# Patient Record
Sex: Male | Born: 1955
Health system: Southern US, Community
[De-identification: ages and names within clinical notes are randomized; demographics above are authoritative.]

## PROBLEM LIST (undated history)

## (undated) DIAGNOSIS — I10 Essential (primary) hypertension: Secondary | ICD-10-CM

## (undated) DIAGNOSIS — K219 Gastro-esophageal reflux disease without esophagitis: Secondary | ICD-10-CM

## (undated) DIAGNOSIS — E119 Type 2 diabetes mellitus without complications: Secondary | ICD-10-CM

## (undated) HISTORY — DX: Type 2 diabetes mellitus without complications: E11.9

## (undated) HISTORY — DX: Essential (primary) hypertension: I10

## (undated) HISTORY — PX: OTHER SURGICAL HISTORY: SHX169

## (undated) HISTORY — DX: Gastro-esophageal reflux disease without esophagitis: K21.9

## (undated) HISTORY — PX: NO PAST SURGERIES: SHX2092

---

## 2000-01-05 ENCOUNTER — Encounter: Payer: Self-pay | Admitting: Emergency Medicine

## 2000-01-05 ENCOUNTER — Emergency Department (HOSPITAL_COMMUNITY): Admission: EM | Admit: 2000-01-05 | Discharge: 2000-01-05 | Payer: Self-pay | Admitting: Emergency Medicine

## 2007-07-22 ENCOUNTER — Ambulatory Visit: Payer: Self-pay | Admitting: General Practice

## 2010-02-01 ENCOUNTER — Ambulatory Visit: Payer: Self-pay | Admitting: General Practice

## 2011-02-20 ENCOUNTER — Ambulatory Visit: Payer: Self-pay | Admitting: General Practice

## 2016-05-09 ENCOUNTER — Other Ambulatory Visit: Payer: Self-pay | Admitting: Physician Assistant

## 2016-06-19 ENCOUNTER — Other Ambulatory Visit: Payer: Self-pay | Admitting: Family Medicine

## 2016-06-19 ENCOUNTER — Ambulatory Visit
Admission: RE | Admit: 2016-06-19 | Discharge: 2016-06-19 | Disposition: A | Discharge status: Home / Self Care | Payer: BLUE CROSS/BLUE SHIELD | Source: Ambulatory Visit | Attending: Family Medicine | Admitting: Family Medicine

## 2016-06-19 DIAGNOSIS — R600 Localized edema: Secondary | ICD-10-CM | POA: Diagnosis not present

## 2016-06-19 DIAGNOSIS — M7121 Synovial cyst of popliteal space [Baker], right knee: Secondary | ICD-10-CM | POA: Insufficient documentation

## 2016-06-19 DIAGNOSIS — R52 Pain, unspecified: Secondary | ICD-10-CM

## 2016-11-13 IMAGING — US US EXTREM LOW VENOUS*R*
1 series · 14 of 24 positions shown · non-contrast
Comparison: None.

CLINICAL DATA: Right lower extremity edema for 3 weeks.

EXAM:
RIGHT LOWER EXTREMITY VENOUS DOPPLER ULTRASOUND
TECHNIQUE: Gray-scale sonography with graded compression, as well as color
Doppler and duplex ultrasound, were performed to evaluate the deep
venous system from the level of the common femoral vein through the
popliteal and proximal calf veins. Spectral Doppler was utilized to
evaluate flow at rest and with distal augmentation maneuvers.

[Series 1: us extrem low venous*right* · 0.09mm/px · 14 of 41 slices shown]
[im 1/41]
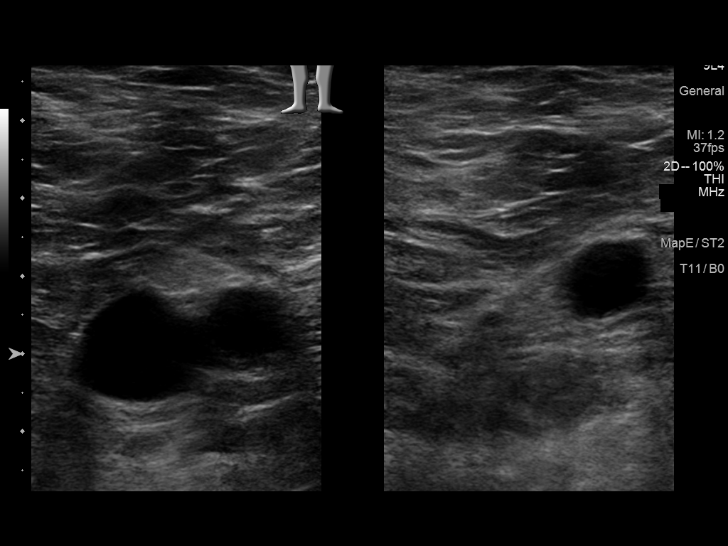
[im 4/41]
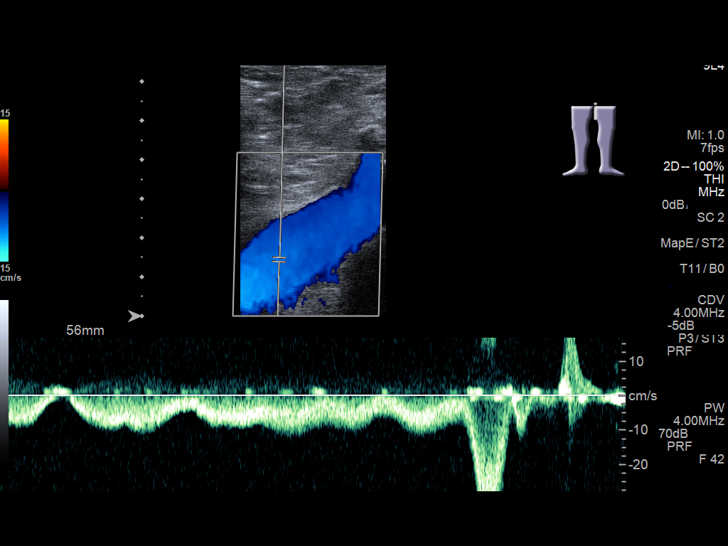
[im 7/41]
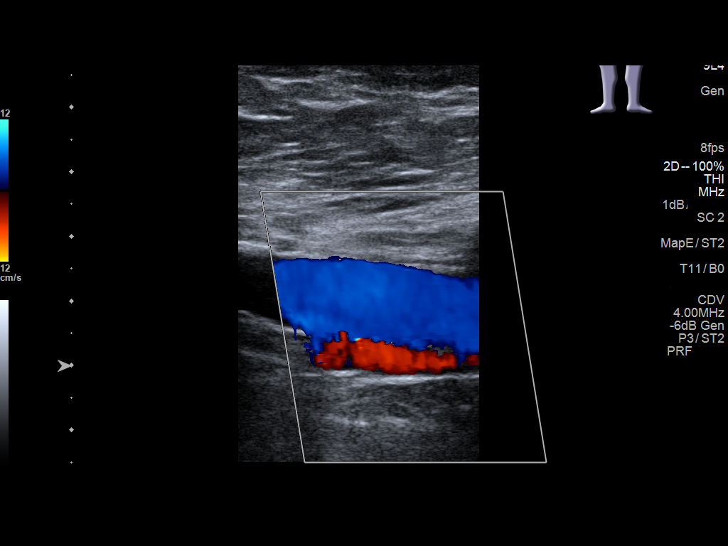
[im 11/41]
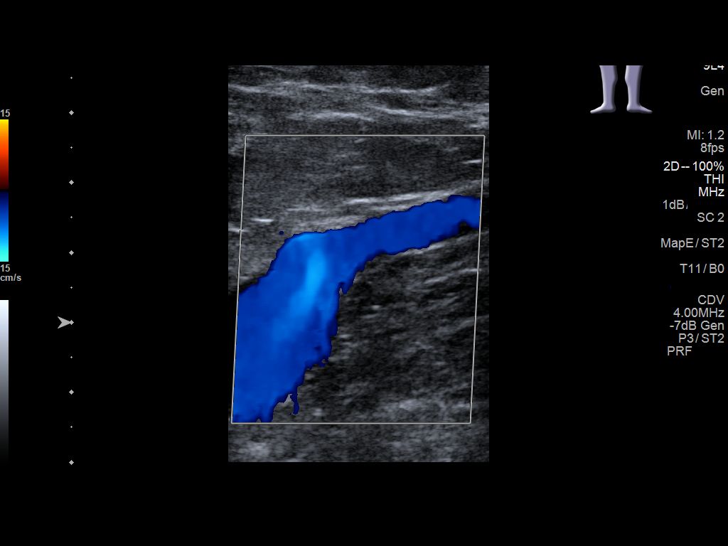
[im 13/41]
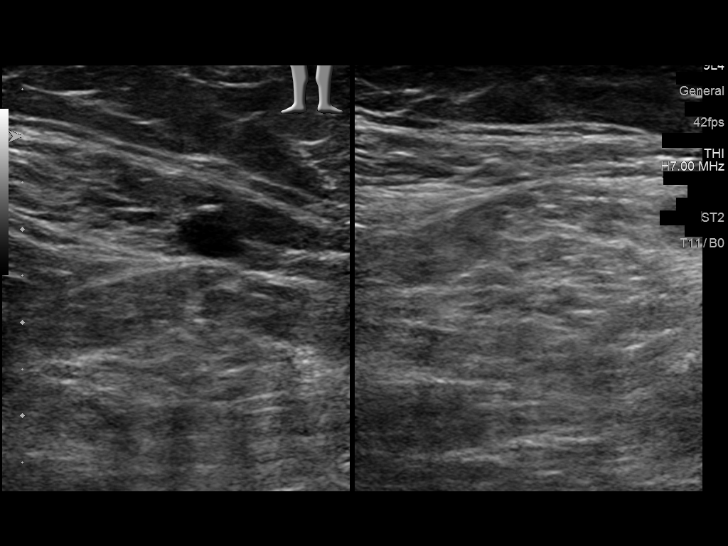
[im 16/41]
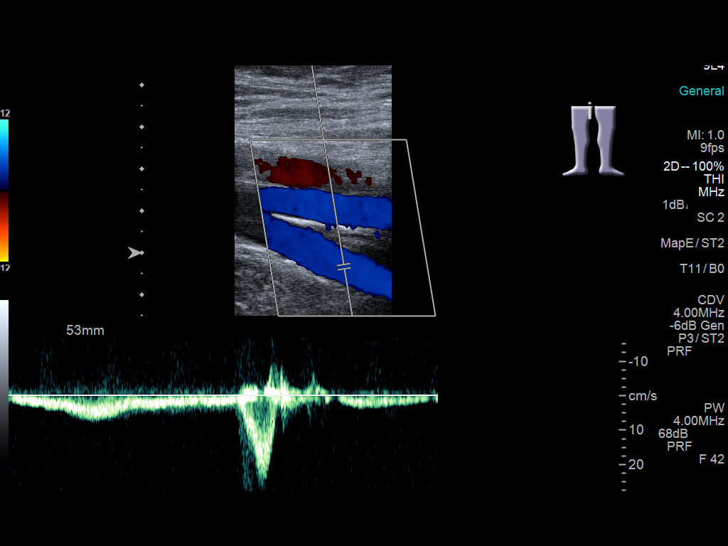
[im 20/41]
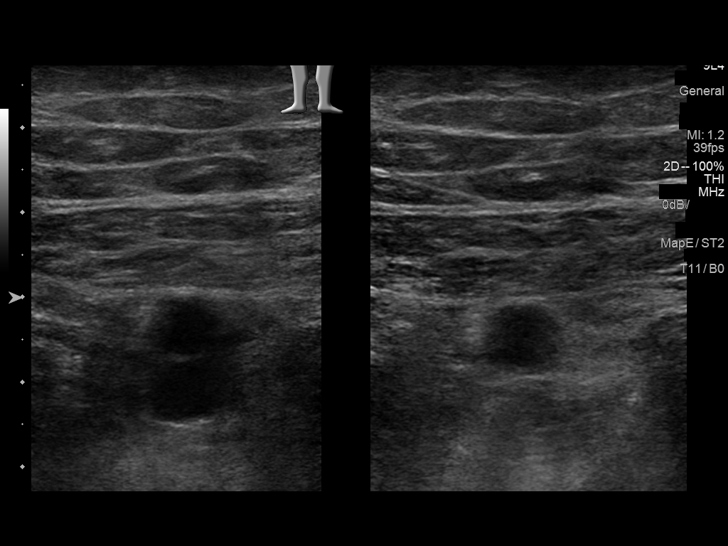
[im 21/41]
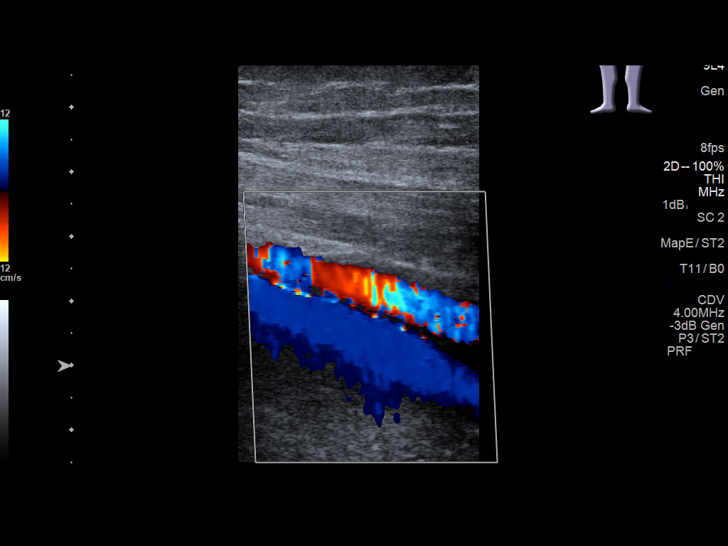
[im 25/41]
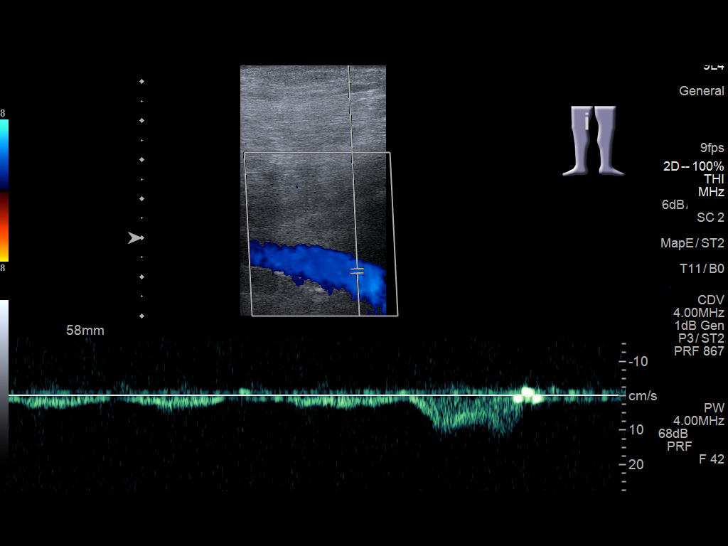
[im 28/41]
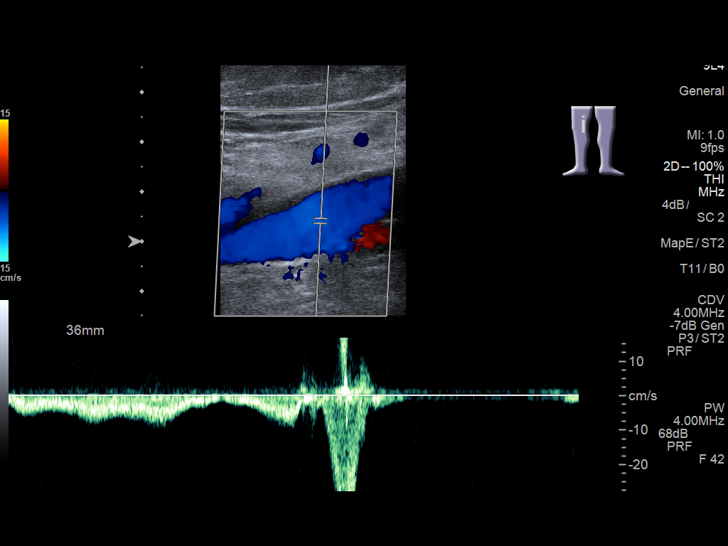
[im 32/41]
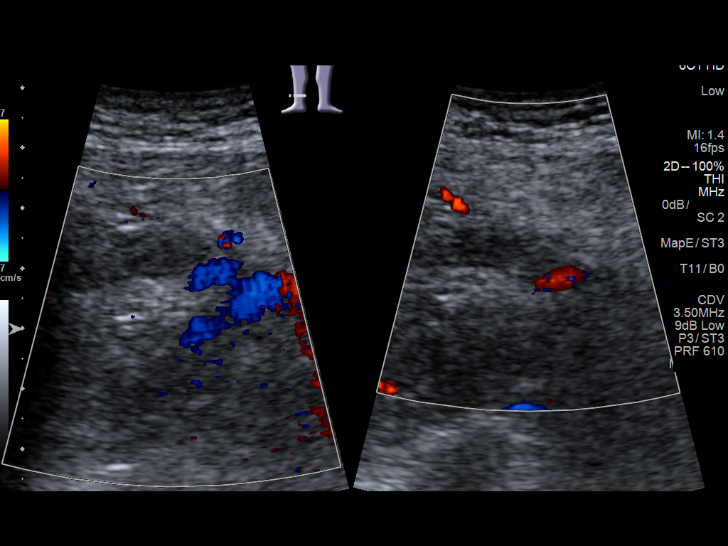
[im 34/41]
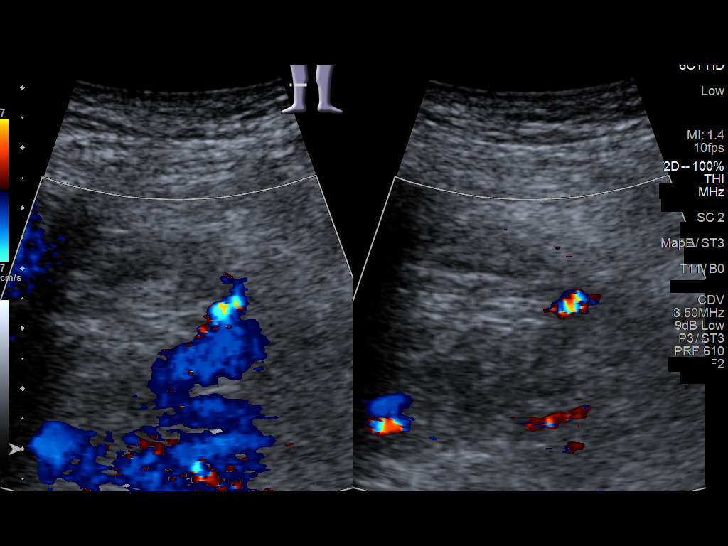
[im 37/41]
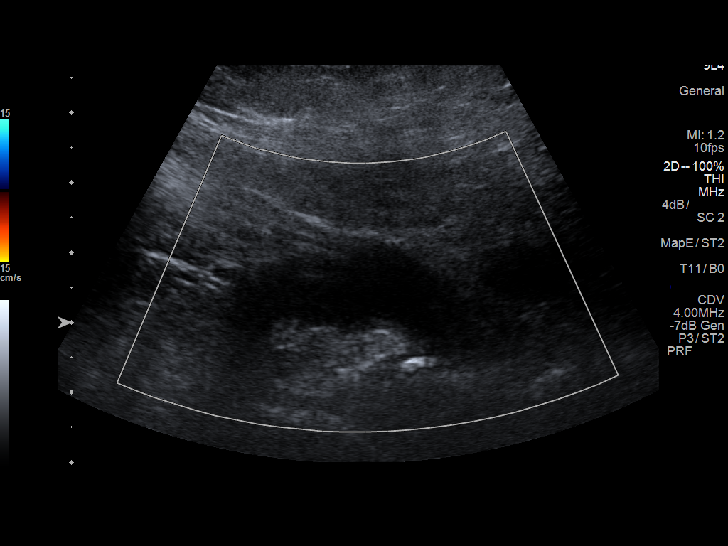
[im 41/41]
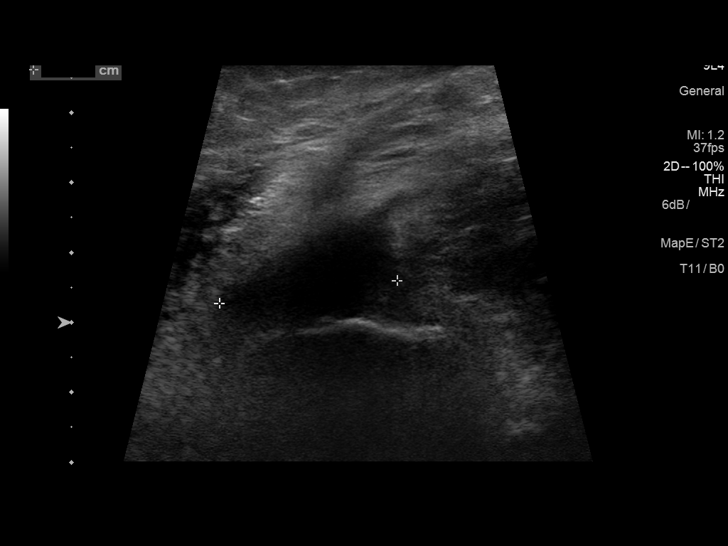

[14 of 24 positions shown; findings below may reference images not displayed]

FINDINGS: Left common femoral vein is patent without thrombus.

Normal compressibility, augmentation and color Doppler flow in the
right common femoral vein, right femoral vein and right popliteal
vein. The right saphenofemoral junction is patent. Right profunda
femoral vein is patent without thrombus. Visualized right deep calf
veins are patent without thrombus.

Irregular shaped hypoechoic structure in the right popliteal fossa.
This structure measures 4.5 x 1.4 x 2.6 cm. Findings are suggestive
for a fluid collection.
IMPRESSION: Negative for deep venous thrombosis in right lower extremity.

Right Baker's cyst.

## 2016-11-26 LAB — HM COLONOSCOPY

## 2019-01-04 ENCOUNTER — Other Ambulatory Visit: Payer: Self-pay | Admitting: Registered Nurse

## 2019-03-04 ENCOUNTER — Other Ambulatory Visit: Payer: Self-pay

## 2019-03-05 LAB — CMP12+LP+TP+TSH+6AC+PSA+CBC…
ALT: 28 IU/L
AST: 22 IU/L (ref 0–40)
Albumin/Globulin Ratio: 1.6 (ref 1.2–2.2)
Albumin: 4.2 g/dL (ref 3.8–4.8)
BUN/Creatinine Ratio: 17
Basophils Absolute: 0.1 10*3/uL
Basos: 1 %
Bilirubin Total: 0.2 mg/dL (ref 0.0–1.2)
Calcium: 9 mg/dL
Chloride: 101 mmol/L (ref 96–106)
Chol/HDL Ratio: 4 ratio
Cholesterol, Total: 158 mg/dL
EOS (ABSOLUTE): 0.1 10*3/uL
Eos: 1 %
Free Thyroxine Index: 1.9 (ref 1.2–4.9)
GGT: 21 IU/L
Globulin, Total: 2.7 g/dL (ref 1.5–4.5)
Glucose: 198 mg/dL — ABNORMAL HIGH (ref 65–99)
HDL: 40 mg/dL (ref >39)
Hematocrit: 40 %
Hemoglobin: 13.5 g/dL
Immature Granulocytes: 1 %
Iron: 84 ug/dL
LDH: 160 IU/L
LDL Calculated: 98 mg/dL
Lymphocytes Absolute: 1.9 10*3/uL
Lymphs: 22 %
MCH: 28.8 pg
MCHC: 33.8 g/dL
MCV: 85 fL
Monocytes Absolute: 0.6 10*3/uL
Monocytes: 7 %
Neutrophils Absolute: 5.7 10*3/uL
Neutrophils: 68 %
Phosphorus: 2.9 mg/dL
Platelets: 247 10*3/uL (ref 150–450)
Potassium: 4.6 mmol/L (ref 3.5–5.2)
Prostate Specific Ag, Serum: 0.6 ng/mL
RBC: 4.69 x10E6/uL
RDW: 12.5 %
Sodium: 138 mmol/L (ref 134–144)
T3 Uptake Ratio: 24 %
T4, Total: 7.8 ug/dL (ref 4.5–12.0)
TSH: 2.69 u[IU]/mL (ref 0.450–4.500)
Total Protein: 6.9 g/dL (ref 6.0–8.5)
Triglycerides: 102 mg/dL
Uric Acid: 4.8 mg/dL
VLDL Cholesterol Cal: 20 mg/dL (ref 5–40)
WBC: 8.3 10*3/uL (ref 3.4–10.8)

## 2019-03-05 LAB — CMP12+LP+TP+TSH+6AC+PSA+CBC?
Alkaline Phosphatase: 88 IU/L
BUN: 13 mg/dL
Creatinine, Ser: 0.76 mg/dL
Immature Grans (Abs): 0.1 10*3/uL

## 2019-03-05 LAB — MICROALBUMIN / CREATININE URINE RATIO
Creatinine, Urine: 143.5 mg/dL
Microalb/Creat Ratio: 3 mg/g creat (ref 0–29)
Microalbumin, Urine: 4.4 ug/mL

## 2019-03-05 LAB — HGB A1C W/O EAG: Hgb A1c MFr Bld: 9.3 % — ABNORMAL HIGH (ref 4.8–5.6)

## 2019-05-01 ENCOUNTER — Other Ambulatory Visit: Payer: Self-pay

## 2019-05-01 MED ORDER — PANTOPRAZOLE SODIUM 40 MG PO TBEC: 40.0000 mg | DELAYED_RELEASE_TABLET | Freq: Every day | ORAL | 1 refills | Status: DC | PRN

## 2019-05-01 NOTE — Telephone Encounter (Signed)
Pt seen for physical on 03-04-19 states protonix is helpful in controlling GERD. Last refill was 02-03-09 #30 with 3 refills.

## 2019-06-09 ENCOUNTER — Other Ambulatory Visit: Payer: Self-pay

## 2019-06-09 ENCOUNTER — Ambulatory Visit: Payer: Self-pay | Admitting: Internal Medicine

## 2019-06-09 ENCOUNTER — Encounter: Payer: Self-pay | Admitting: Internal Medicine

## 2019-06-09 DIAGNOSIS — E785 Hyperlipidemia, unspecified: Secondary | ICD-10-CM | POA: Insufficient documentation

## 2019-06-09 DIAGNOSIS — Z9189 Other specified personal risk factors, not elsewhere classified: Secondary | ICD-10-CM

## 2019-06-09 DIAGNOSIS — K219 Gastro-esophageal reflux disease without esophagitis: Secondary | ICD-10-CM | POA: Insufficient documentation

## 2019-06-09 DIAGNOSIS — E7849 Other hyperlipidemia: Secondary | ICD-10-CM

## 2019-06-09 DIAGNOSIS — E119 Type 2 diabetes mellitus without complications: Secondary | ICD-10-CM

## 2019-06-09 DIAGNOSIS — I10 Essential (primary) hypertension: Secondary | ICD-10-CM | POA: Insufficient documentation

## 2019-06-09 DIAGNOSIS — E1169 Type 2 diabetes mellitus with other specified complication: Secondary | ICD-10-CM | POA: Insufficient documentation

## 2019-06-09 LAB — POCT GLYCOSYLATED HEMOGLOBIN (HGB A1C): Hemoglobin A1C: 7.6 % — AB (ref 4.0–5.6)

## 2019-06-09 NOTE — Progress Notes (Signed)
S - Patient with a h/o NIDDM who follows-up for the every 3 month check-up after last in June, and at that time Metformin restarted as he ran out and never refilled and felt needed to restart. Also Lipitor started for higher LDL. BP was ok on lisinopril at that visit.   He noted would think about recommendation last visit to pursue a sleep study as at risk with weight and snores per wife, he denied marked daytime somnolence last visit, and does note he has felt more tired, he thought from the metformin restarting and has decreased to one pill daily (500mg ) as a result to help. He hasn't checked his BS's recently but noted he can.   In general, he has been feeling well with no complaints Taking the medicines regularly Not check blood sugars regularly presently No CP, SOB, urine frequency, numbness or tingling in the extremities, no LE swelling  Does get yearly eye exams.  Trying to eat a healthier diet and manage weight, with some success since last visit Exercise - no regular exercise regimen and working at home not helped noted in June, is back to work and his grandson is with him and helps him be more active, trying to walk more  No tob use except a cigar occasionally   Allergies  Allergen Reactions  . Penicillins   . Sulfa Antibiotics    Current Outpatient Medications on File Prior to Visit  Medication Sig Dispense Refill  . atorvastatin (LIPITOR) 10 MG tablet TAKE 1 TABLET DAILY IN PM    . glimepiride (AMARYL) 4 MG tablet Take 4 mg by mouth daily.    Marland Kitchen lisinopril (ZESTRIL) 20 MG tablet Take 20 mg by mouth daily.    . metFORMIN (GLUCOPHAGE-XR) 500 MG 24 hr tablet Take 500 mg by mouth daily.     . pantoprazole (PROTONIX) 40 MG tablet Take 1 tablet (40 mg total) by mouth daily as needed. 90 tablet 1   No current facility-administered medications on file prior to visit.       O - NAD, masked, obese  BP 137/85 (BP Location: Right Arm, Patient Position: Sitting, Cuff Size: Large)    Pulse 85   Temp 97.7 F (36.5 C)   Resp 16   Ht 6\' 1"  (1.854 m)   Wt (!) 301 lb (136.5 kg)   SpO2 96%   BMI 39.71 kg/m   Weight - 308 last visit  HEENT - sclera anicteric Neck - carotids 2+ and = with no bruits,  Car - RRR without m/g/r Pulm - CTA Abd - obese, NT Ext - no LE edema Neuro - Affect not flat, approp with conversation  Grossly nonfocal  Last colonoscopy - 2018, next rec'ed in 10 years  Labs reviewed including A1C - 9.3 in June, in 7's on checks July and Nov 2019, urine microalb ok in June, LDL 98   A1C today in office - 7.6  A/P - NIDDM - better controlled with restart of Metformin XR, although only at 500mg , not the 1000mg  was on prior  Continue current medications, discussed increasing the dose of the metforminXR, possibly to 500 mg bid or 1000mg  in the am, noted a 750mg  dose can try as well. Agreed to continue the 500mg  dose at present and monitor and if cont's to lose some weight and stay more active, that can help lower BS's as well,  Importance of diet modifications and some regular aerobic exercise encouraged Above to help with weight control/weight loss also important  Discussed why important to keep sugars very well controlled to help protect the heart, kidneys, and lessen future complications. ]If feeling sx's, would check his BS at that time and record on a sheet, also may check occasionally periodically and if higher than would like (>200 consistently), to f/u sooner than planned noted   2.  Hyperlipidemia  statin to continue and tolerating Importance of diet modifications emphasized and some regular aerobic exercise encouraged Above to help with weight control/weight loss also very important   3. Increased BMI/obesity - has lost some weight since last visit  Importance of diet modifications and regular aerobic exercise emphasized as above noted Encouraged and continue to try to lose weight  4. HTN - controlled on medicines  Continue medication  to manage Importance of healthy diet and regular aerobic exercise and weight control noted to help with BP control Continue to monitor  5. GERD - controlled with PPI  6. Sleep apnea concern - at risk and noted his feeling tired may be from this and not the Metformin XR. Discussed this last visit as well.  He agreed to pursue a sleep study after the risk/benefits discussed at length today  Referral written and await testing results  F/u again in 3 months, sooner prn and will f/u before he plans to retire in early December

## 2019-07-02 ENCOUNTER — Ambulatory Visit: Payer: Self-pay

## 2019-07-02 DIAGNOSIS — Z23 Encounter for immunization: Secondary | ICD-10-CM

## 2019-08-07 ENCOUNTER — Other Ambulatory Visit: Payer: Self-pay | Admitting: Internal Medicine

## 2019-08-14 ENCOUNTER — Telehealth: Payer: Self-pay

## 2019-08-14 NOTE — Telephone Encounter (Signed)
James Washington [phone]) called because having difficulty getting Rx refill for Protonix.  Protonix 40 mg tablets 1 po qd  Contacted [employer] at [phone] & spoke with CSR Lattie Haw T) & received authorization for Protonix 40 mg 1 tab po qd. Approval is for 3 years starting today (08/14/2019 - 08/13/2022) & pharmacy may process refill today.  Contacted [facility] [address] [facility] [place] at [phone] to let them know prior authorization to let them know med approved for 3 years.  Contacted [facility] to let him know PA approved & [facility] working on [facility] refill.  AMD

## 2019-08-17 ENCOUNTER — Encounter: Payer: Self-pay | Admitting: Internal Medicine

## 2019-08-17 ENCOUNTER — Other Ambulatory Visit: Payer: Self-pay | Admitting: Internal Medicine

## 2019-08-17 DIAGNOSIS — K219 Gastro-esophageal reflux disease without esophagitis: Secondary | ICD-10-CM

## 2019-08-17 NOTE — Telephone Encounter (Signed)
Currently on schedule for DM labs on 09/03/2019 & follow up appt on 09/10/2019.  Added Magnesium to the notes on 09/03/2019.  AMD

## 2019-08-17 NOTE — Telephone Encounter (Signed)
Patient diabetic last seen by Dr Roxan Hockey 06/09/2019. Long term PPI use; last labs exec panel 03/04/2019 stable and HgbA1c stat POC 06/09/2019 7.6 will be due labs Dec for diabetes will add magnesium nonfasting.  Electronic Rx for 90 day supply protonix 40mg  po daily sent to his pharmacy of choice today.

## 2019-08-17 NOTE — Telephone Encounter (Signed)
noted 

## 2019-09-03 ENCOUNTER — Other Ambulatory Visit: Payer: Self-pay

## 2019-09-03 DIAGNOSIS — E119 Type 2 diabetes mellitus without complications: Secondary | ICD-10-CM

## 2019-09-03 NOTE — Progress Notes (Signed)
Magnesium added per request of Gerarda Fraction, NP-C (Interim Provider).  Scheduled to review lab results 09/10/2019 with Laurey Morale, PA-C (Interim Provider).  AMD

## 2019-09-04 LAB — MAGNESIUM: Magnesium: 2.1 mg/dL (ref 1.6–2.3)

## 2019-09-04 LAB — BUN+CREAT
BUN/Creatinine Ratio: 20 (ref 10–24)
BUN: 17 mg/dL (ref 8–27)
Creatinine, Ser: 0.83 mg/dL (ref 0.76–1.27)
GFR calc Af Amer: 107 mL/min/{1.73_m2} (ref >59)
GFR calc non Af Amer: 93 mL/min/{1.73_m2} (ref >59)

## 2019-09-04 LAB — HGB A1C W/O EAG: Hgb A1c MFr Bld: 8.8 % — ABNORMAL HIGH (ref 4.8–5.6)

## 2019-09-10 ENCOUNTER — Ambulatory Visit: Payer: Self-pay | Admitting: Physician Assistant

## 2019-09-10 ENCOUNTER — Other Ambulatory Visit: Payer: Self-pay

## 2019-09-10 ENCOUNTER — Encounter: Payer: Self-pay | Admitting: Physician Assistant

## 2019-09-10 VITALS — BP 130/80 | HR 94 | Temp 99.2°F | Resp 12 | Ht 73.0 in | Wt 301.0 lb

## 2019-09-10 DIAGNOSIS — I10 Essential (primary) hypertension: Secondary | ICD-10-CM

## 2019-09-10 DIAGNOSIS — E119 Type 2 diabetes mellitus without complications: Secondary | ICD-10-CM

## 2019-09-10 NOTE — Progress Notes (Signed)
   Subjective:    Patient ID: James Washington, male    DOB: [birth year], 63 y.o.   MRN: [id number]  HPI   6 mos f/u   - Had Annual exam with Dr Roxan Hockey  03/10/2019,   3 mos exam 06/09/2019    137/85     301    A1C   7.6 6 mos exam today          130/80               301          A1C 8.8  63 yo M with longstanding DM MetforminER  1000mg   1 QD Glimepiride 4 mg 1 po QD  Today A1 C   8.8  HT hx  Lisinopril 20mg  1 QD understands that diet and weight play important part  Lipids- Lipitor 10 mg 1 qd  GERD-  Protonix 40 mg 1 QD,  DIetary managment  Diet indiscretion- readily admits to eating "whatever" No walking  program since her retired in Aug  Aware that exercise is important particularly with increased dietary intake  Review of Systems Denies particular concerns    Objective:   Physical Exam  Wt 301 BP 130/80 Alert interactive relaxed HEENT grossly WNL - left eyelid with enlarging cyst /lesion Has been addressed in past and patient encouraged to have Opthal consult Neck supple , no glandular enlargement Breathing non labored RSR no GMR Gynecomastia Obese , no mass noted, BS WNL Ambulatory w/o hesitation  on /off table unassisted      Assessment & Plan:   Reviewed dietary recommendations Discussed concern over A1C  8.8 Start today with 10 minute brisk walk TID As tolerance improves may adjust to 20 TID or 30 QD  He agrees to Passenger transport manager together re :same Return to basic DM guidelines- serving sizes- healthy food choices....ROS  Rec consultation Opthal  RTC 2 month DM follow up  A1C- WEIGHT - BP Encouraged keeping a calendar of walking and other exercise to be reviewed at f/u

## 2019-09-10 NOTE — Progress Notes (Signed)
2x - 1 time at beach & 1 time at home had some whelps around his neck after eating shrimp & scallops. States has eaten all his life without problems.  AMD

## 2019-09-21 ENCOUNTER — Encounter: Payer: Self-pay | Admitting: Physician Assistant

## 2019-11-12 ENCOUNTER — Other Ambulatory Visit: Payer: Self-pay

## 2019-11-12 ENCOUNTER — Ambulatory Visit: Payer: Self-pay | Admitting: Physician Assistant

## 2019-11-12 VITALS — BP 124/90 | HR 95 | Temp 98.1°F | Ht 73.0 in | Wt 299.6 lb

## 2019-11-12 DIAGNOSIS — I10 Essential (primary) hypertension: Secondary | ICD-10-CM

## 2019-11-12 DIAGNOSIS — K219 Gastro-esophageal reflux disease without esophagitis: Secondary | ICD-10-CM

## 2019-11-12 DIAGNOSIS — E119 Type 2 diabetes mellitus without complications: Secondary | ICD-10-CM

## 2019-11-12 LAB — POCT GLYCOSYLATED HEMOGLOBIN (HGB A1C)
HbA1c POC (<> result, manual entry): 8.5 % (ref 4.0–5.6)
Hemoglobin A1C: 8.5 % — AB (ref 4.0–5.6)

## 2019-11-12 NOTE — Progress Notes (Signed)
   Subjective:    Patient ID: James Washington, male    DOB: 1955-11-04, 64 y.o.   MRN: [id number]  HPI DM follow up patient now retired and will transition to PCP in town  He wanted to follow up as he" took last meeting to heart" And wanted to share his new efforts to be healthy Before transitioning away  [place]. [place] one direction is harder than the other and tends to take the easy way.    He wanted to clarify that he has been taking Metformin 500 q AM Some time ago was actually prescribed 1000mg  QAM but didn't feel well taking it...got sleepy, no GI symtoms.   Had cut back and is still take Metformin  500mg XR  q AM Glimeperide 4 mg q AM  Watching serving sizes- avoiding snacking "when possible" Trying to make healthier choices  Today is 2 month f/u A1C today 8.5 -down from 8.8 Weight today 299- down from 301   Review of Systems As noted    Objective:   Physical Exam  As above  BP 124/90  Left eyelid cyst continues to enlarge-       Assessment & Plan:  Now that retired and time not an issue have challenged him [place] - challenged him to alternating between hard and easy direction each exercise adventure. Next goal to do hard loop-and then after resting briefly, doing the easier loop before heading home.  Time and exertion goal for increased calorie burn  Weight loss goals for BP and DM  His new PCP visit is in 2 weeks. We discussed , and have deferred making any dosing changes at this time as he has enough medication to bridge left right now.  Stressed increasing his exercise and focusing hard on dietary choices and serving sizes-- may come by for weight check next week PRN  Opthal exam encouraged to address cyst left upper eyelid as it continues to enlarge  Congratulated on improvements made and encouraged continued focus!

## 2019-11-26 ENCOUNTER — Ambulatory Visit (INDEPENDENT_AMBULATORY_CARE_PROVIDER_SITE_OTHER): Payer: Medicare Other | Admitting: Family Medicine

## 2019-11-26 ENCOUNTER — Other Ambulatory Visit: Payer: Self-pay

## 2019-11-26 ENCOUNTER — Encounter: Payer: Self-pay | Admitting: Family Medicine

## 2019-11-26 VITALS — BP 138/72 | HR 86 | Temp 97.1°F | Ht 72.0 in | Wt 297.5 lb

## 2019-11-26 DIAGNOSIS — E7849 Other hyperlipidemia: Secondary | ICD-10-CM | POA: Diagnosis not present

## 2019-11-26 DIAGNOSIS — E119 Type 2 diabetes mellitus without complications: Secondary | ICD-10-CM | POA: Diagnosis not present

## 2019-11-26 DIAGNOSIS — I10 Essential (primary) hypertension: Secondary | ICD-10-CM

## 2019-11-26 NOTE — Assessment & Plan Note (Signed)
Elevated. Has been exercising/working on diet. Willing to try dose increase of metformin. Encouraged working to maximum tolerated dose. Return in 3 months for recheck

## 2019-11-26 NOTE — Assessment & Plan Note (Signed)
Discussed that he would benefit based on ASCVD and with diabetes may benefit from tighter control and will increase atorvastatin. Could stop ASA as he has already taken 10+ years

## 2019-11-26 NOTE — Patient Instructions (Addendum)
#  Diabetes - try to increase the metformin - start by taking 2 pills in the morning - If tolerating after 2 weeks, can start taking 3 pills  #Cholesterol medication - start taking 2 pills per day  Return in 3 months for diabetes check

## 2019-11-26 NOTE — Assessment & Plan Note (Signed)
BP at goal, continue lisinopril

## 2019-11-26 NOTE — Progress Notes (Signed)
Subjective:     James Washington is a 64 y.o. male presenting for Establish Care (previous PCP with [facility]) and Discuss cholesterol medication     HPI    #Diabetes Currently taking metformin and glimepiride -- metformin at high doses caused him to feel sleepy -- continues on glimepiride   Using medications without difficulties: No Hypoglycemic episodes:No  Hyperglycemic episodes:No  Feet problems:No  Last HgbA1c:  Lab Results  Component Value Date   HGBA1C 8.5 (A) 11/12/2019   HGBA1C 8.5 11/12/2019    Diabetes Health Maintenance Due:    Diabetes Health Maintenance Due  Topic Date Due  . FOOT EXAM  09/07/1964  . OPHTHALMOLOGY EXAM  09/07/1964  . HEMOGLOBIN A1C  05/11/2020    #HTN - on medication  #HLD - no hx of high cholesterol - started medication when diagnosed with diabetes - not currently with side effects -   #tobacco use - cigar when playing    Review of Systems   Social History   Tobacco Use  Smoking Status Current Some Day Smoker  . Years: 10.00  . Types: Cigars  Smokeless Tobacco Never Used  Tobacco Comment    once a month        Objective:    BP Readings from Last 3 Encounters:  11/26/19 138/72  11/12/19 124/90  09/10/19 130/80   Wt Readings from Last 3 Encounters:  11/26/19 297 lb 8 oz (134.9 kg)  11/12/19 299 lb 9.6 oz (135.9 kg)  09/10/19 (!) 301 lb (136.5 kg)    BP 138/72   Pulse 86   Temp (!) 97.1 F (36.2 C)   Ht 6' (1.829 m)   Wt 297 lb 8 oz (134.9 kg)   SpO2 98%   BMI 40.35 kg/m    Physical Exam Constitutional:      Appearance: Normal appearance. He is not ill-appearing or diaphoretic.  HENT:     Right Ear: External ear normal.     Left Ear: External ear normal.     Nose: Nose normal.  Eyes:     General: No scleral icterus.    Extraocular Movements: Extraocular movements intact.     Conjunctiva/sclera: Conjunctivae normal.  Cardiovascular:     Rate and Rhythm: Normal rate and  regular rhythm.  Pulmonary:     Effort: Pulmonary effort is normal. No respiratory distress.     Breath sounds: Normal breath sounds. No wheezing.  Musculoskeletal:     Cervical back: Neck supple.  Skin:    General: Skin is warm and dry.  Neurological:     Mental Status: He is alert. Mental status is at baseline.  Psychiatric:        Mood and Affect: Mood normal.      Lipid Panel     Component Value Date/Time   CHOL 158 03/04/2019 0930   TRIG 102 03/04/2019 0930   HDL 40 03/04/2019 0930   CHOLHDL 4.0 03/04/2019 0930   LDLCALC 98 03/04/2019 0930   LABVLDL 20 03/04/2019 0930     The 10-year ASCVD risk score Mikey Bussing DC Jr., et al., 2013) is: 38.1%   Values used to calculate the score:     Age: 56 years     Sex: Male     Is Non-Hispanic African American: No     Diabetic: Yes     Tobacco smoker: Yes     Systolic Blood Pressure: 299 mmHg     Is BP treated: Yes  HDL Cholesterol: 40 mg/dL     Total Cholesterol: 158 mg/dL      Assessment & Plan:   Problem List Items Addressed This Visit      Cardiovascular and Mediastinum   HTN (hypertension)    BP at goal, continue lisinopril        Endocrine   Diabetes (HCC) - Primary    Elevated. Has been exercising/working on diet. Willing to try dose increase of metformin. Encouraged working to maximum tolerated dose. Return in 3 months for recheck      Relevant Medications   metFORMIN (GLUCOPHAGE-XR) 500 MG 24 hr tablet     Other   Hyperlipidemia    Discussed that he would benefit based on ASCVD and with diabetes may benefit from tighter control and will increase atorvastatin. Could stop ASA as he has already taken 10+ years          Return in about 3 months (around 02/23/2020) for diabetes check.  Lynnda Child, MD

## 2019-12-07 ENCOUNTER — Other Ambulatory Visit: Payer: Self-pay | Admitting: Family Medicine

## 2019-12-07 DIAGNOSIS — E7849 Other hyperlipidemia: Secondary | ICD-10-CM

## 2019-12-07 MED ORDER — ATORVASTATIN CALCIUM 20 MG PO TABS: 20.0000 mg | ORAL_TABLET | Freq: Every evening | ORAL | 0 refills | Status: DC

## 2019-12-07 NOTE — Telephone Encounter (Signed)
Pulled in Atorvastatin with updated dose. Ok to send in test strips? How often should patient check his sugar a day?

## 2019-12-07 NOTE — Telephone Encounter (Signed)
Patient called He was in to see Dr Selena Batten on the 25th and she increased his Atorvastatin.  Patient had some tablets left and ended up doubling up to start the new dose, he stated he is now out of medication and needs the new script sent in for him.    Patient also stated he is trying to do better and check his sugars but wanted to know if he can get the CONTOUR NEST test strips without a script or if he needed a script sent.  If so he would like the script to be sent also.   [facility] [address] [place]

## 2019-12-07 NOTE — Telephone Encounter (Signed)
Noted, refill sent for atorvastatin 20 mg.  He can check 1-2 times daily:  Before any meal, 2 hours after any meal, bedtime.

## 2019-12-08 MED ORDER — BLOOD GLUCOSE METER KIT: PACK | 12 refills | Status: AC

## 2019-12-08 MED ORDER — CONTOUR NEXT TEST VI STRP: ORAL_STRIP | 12 refills | Status: DC

## 2019-12-08 NOTE — Telephone Encounter (Signed)
Patient advised of everything. RXs sent in

## 2019-12-09 ENCOUNTER — Encounter: Payer: Self-pay | Admitting: Gastroenterology

## 2019-12-14 ENCOUNTER — Other Ambulatory Visit: Payer: Self-pay | Admitting: Family Medicine

## 2019-12-14 ENCOUNTER — Other Ambulatory Visit: Payer: Self-pay | Admitting: Primary Care

## 2019-12-14 DIAGNOSIS — E7849 Other hyperlipidemia: Secondary | ICD-10-CM

## 2020-01-30 ENCOUNTER — Other Ambulatory Visit: Payer: Self-pay | Admitting: Registered Nurse

## 2020-01-31 NOTE — Telephone Encounter (Signed)
In replacements rx inbox [facility] patient

## 2020-02-12 ENCOUNTER — Other Ambulatory Visit: Payer: Self-pay | Admitting: Family Medicine

## 2020-02-12 ENCOUNTER — Other Ambulatory Visit: Payer: Self-pay | Admitting: Registered Nurse

## 2020-02-17 ENCOUNTER — Ambulatory Visit (INDEPENDENT_AMBULATORY_CARE_PROVIDER_SITE_OTHER): Payer: Medicare Other | Admitting: Family Medicine

## 2020-02-17 ENCOUNTER — Other Ambulatory Visit: Payer: Self-pay

## 2020-02-17 ENCOUNTER — Encounter: Payer: Self-pay | Admitting: Family Medicine

## 2020-02-17 VITALS — BP 138/76 | HR 94 | Temp 97.2°F | Ht 72.0 in | Wt 301.0 lb

## 2020-02-17 DIAGNOSIS — E119 Type 2 diabetes mellitus without complications: Secondary | ICD-10-CM | POA: Diagnosis not present

## 2020-02-17 DIAGNOSIS — E7849 Other hyperlipidemia: Secondary | ICD-10-CM

## 2020-02-17 DIAGNOSIS — Z1159 Encounter for screening for other viral diseases: Secondary | ICD-10-CM

## 2020-02-17 DIAGNOSIS — Z114 Encounter for screening for human immunodeficiency virus [HIV]: Secondary | ICD-10-CM

## 2020-02-17 DIAGNOSIS — I10 Essential (primary) hypertension: Secondary | ICD-10-CM | POA: Diagnosis not present

## 2020-02-17 DIAGNOSIS — Z23 Encounter for immunization: Secondary | ICD-10-CM

## 2020-02-17 DIAGNOSIS — K219 Gastro-esophageal reflux disease without esophagitis: Secondary | ICD-10-CM

## 2020-02-17 LAB — POCT GLYCOSYLATED HEMOGLOBIN (HGB A1C): Hemoglobin A1C: 9 % — AB (ref 4.0–5.6)

## 2020-02-17 LAB — COMPREHENSIVE METABOLIC PANEL
ALT: 24 U/L (ref 0–53)
AST: 19 U/L (ref 0–37)
Albumin: 4.3 g/dL (ref 3.5–5.2)
Alkaline Phosphatase: 95 U/L (ref 39–117)
BUN: 17 mg/dL (ref 6–23)
CO2: 31 mEq/L (ref 19–32)
Calcium: 10.1 mg/dL (ref 8.4–10.5)
Chloride: 99 mEq/L (ref 96–112)
Creatinine, Ser: 0.96 mg/dL (ref 0.40–1.50)
GFR: 78.5 mL/min (ref >60.00)
Glucose, Bld: 189 mg/dL — ABNORMAL HIGH (ref 70–99)
Potassium: 5.1 mEq/L (ref 3.5–5.1)
Sodium: 136 mEq/L (ref 135–145)
Total Bilirubin: 0.4 mg/dL (ref 0.2–1.2)
Total Protein: 7.4 g/dL (ref 6.0–8.3)

## 2020-02-17 LAB — LIPID PANEL
Cholesterol: 111 mg/dL (ref 0–200)
HDL: 39.3 mg/dL (ref >39.00)
LDL Cholesterol: 47 mg/dL (ref 0–99)
NonHDL: 71.88
Total CHOL/HDL Ratio: 3
Triglycerides: 126 mg/dL (ref 0.0–149.0)
VLDL: 25.2 mg/dL (ref 0.0–40.0)

## 2020-02-17 MED ORDER — DAPAGLIFLOZIN PROPANEDIOL 5 MG PO TABS: 5.0000 mg | ORAL_TABLET | Freq: Every day | ORAL | 2 refills | Status: DC

## 2020-02-17 MED ORDER — METFORMIN HCL ER (MOD) 1000 MG PO TB24: 2000.0000 mg | ORAL_TABLET | Freq: Every day | ORAL | 3 refills | Status: DC

## 2020-02-17 NOTE — Assessment & Plan Note (Signed)
Has tried stopping medication but symptoms return regardless of diet. Continue pantoprazole.

## 2020-02-17 NOTE — Assessment & Plan Note (Signed)
Poorly controlled diabetes on 2 agents. Discussed increasing metformin to maximum tolerated dose as well as adding Marcelline Deist (he will call if too expensive). Discussed dietary changes and weight loss - he declined nutrition referral as aware of the changes he needs to make and will try to make changes.

## 2020-02-17 NOTE — Assessment & Plan Note (Signed)
Stable on lisinopril. Cont medication

## 2020-02-17 NOTE — Progress Notes (Signed)
Subjective:     James Washington is a 64 y.o. male presenting for Diabetes (follow up)     HPI  #Diabetes Currently taking metformin ER in the morning, glimepiride  Using medications without difficulties: No Hypoglycemic episodes:No  Hyperglycemic episodes:Yes  Feet problems:No  Blood Sugars averaging: all over the place, 130-170 Last HgbA1c:  Lab Results  Component Value Date   HGBA1C 9.0 (A) 02/17/2020   Diet: not great - avoids sugars, but does still eat a lot of carbs -- has worked with dietician in the past -- knows what he needs to do, just needs to do it -- portion control is his biggest challenge  Diabetes Health Maintenance Due:    Diabetes Health Maintenance Due  Topic Date Due  . OPHTHALMOLOGY EXAM  Never done  . HEMOGLOBIN A1C  08/19/2020  . FOOT EXAM  02/16/2021      Review of Systems  11/26/2019: Clinic - Hgb A1c 8.5 > continue glimeride, increased metformin, diet. HLD - increase atorvastatin   Social History   Tobacco Use  Smoking Status Current Some Day Smoker  . Years: 10.00  . Types: Cigars  Smokeless Tobacco Never Used  Tobacco Comment    once a month        Objective:    BP Readings from Last 3 Encounters:  02/17/20 138/76  11/26/19 138/72  11/12/19 124/90   Wt Readings from Last 3 Encounters:  02/17/20 (!) 301 lb (136.5 kg)  11/26/19 297 lb 8 oz (134.9 kg)  11/12/19 299 lb 9.6 oz (135.9 kg)    BP 138/76   Pulse 94   Temp (!) 97.2 F (36.2 C)   Ht 6' (1.829 m)   Wt (!) 301 lb (136.5 kg)   SpO2 96%   BMI 40.82 kg/m    Physical Exam Constitutional:      Appearance: Normal appearance. He is not ill-appearing or diaphoretic.  HENT:     Right Ear: External ear normal.     Left Ear: External ear normal.     Nose: Nose normal.  Eyes:     General: No scleral icterus.    Extraocular Movements: Extraocular movements intact.     Conjunctiva/sclera: Conjunctivae normal.  Cardiovascular:     Rate and Rhythm:  Normal rate and regular rhythm.     Heart sounds: No murmur.  Pulmonary:     Effort: Pulmonary effort is normal. No respiratory distress.     Breath sounds: Normal breath sounds. No wheezing.  Musculoskeletal:     Cervical back: Neck supple.  Skin:    General: Skin is warm and dry.  Neurological:     Mental Status: He is alert. Mental status is at baseline.  Psychiatric:        Mood and Affect: Mood normal.     Diabetic Foot Exam - Simple   Simple Foot Form Diabetic Foot exam was performed with the following findings: Yes 02/17/2020 10:41 AM  Visual Inspection No deformities, no ulcerations, no other skin breakdown bilaterally: Yes Sensation Testing Intact to touch and monofilament testing bilaterally: Yes Pulse Check Posterior Tibialis and Dorsalis pulse intact bilaterally: Yes Comments          Assessment & Plan:   Problem List Items Addressed This Visit      Cardiovascular and Mediastinum   HTN (hypertension)    Stable on lisinopril. Cont medication      Relevant Orders   Lipid panel     Digestive   GERD (  gastroesophageal reflux disease)    Has tried stopping medication but symptoms return regardless of diet. Continue pantoprazole.         Endocrine   Type 2 diabetes mellitus (HCC) - Primary    Poorly controlled diabetes on 2 agents. Discussed increasing metformin to maximum tolerated dose as well as adding Marcelline Deist (he will call if too expensive). Discussed dietary changes and weight loss - he declined nutrition referral as aware of the changes he needs to make and will try to make changes.       Relevant Medications   metFORMIN (GLUMETZA) 1000 MG (MOD) 24 hr tablet   dapagliflozin propanediol (FARXIGA) 5 MG TABS tablet   Other Relevant Orders   POCT glycosylated hemoglobin (Hb A1C) (Completed)   Comprehensive metabolic panel   Lipid panel     Other   Hyperlipidemia    Continue atorvastatin, repeat lipids today.       Relevant Orders   Lipid  panel    Other Visit Diagnoses    Encounter for hepatitis C screening test for low risk patient       Relevant Orders   Hepatitis C antibody   Screening for HIV (human immunodeficiency virus)       Relevant Orders   HIV Antibody (routine testing w rflx)   Need for vaccination with 13-polyvalent pneumococcal conjugate vaccine           Return in about 3 months (around 05/19/2020).  Lynnda Child, MD

## 2020-02-17 NOTE — Assessment & Plan Note (Addendum)
Continue atorvastatin, repeat lipids today.

## 2020-02-17 NOTE — Patient Instructions (Addendum)
Diabetes 1) Start taking 2000 mg of metformin daily  2) Start Marcelline Deist daily 5 mg -- Rarely can cause urinary symptoms   Call if after 1 month on both medications your fasting (AM) CBG >140. We will want to try increasing the [place]

## 2020-02-18 LAB — HEPATITIS C ANTIBODY
Hepatitis C Ab: NONREACTIVE
SIGNAL TO CUT-OFF: 0.01 (ref <1.00)

## 2020-02-18 LAB — HIV ANTIBODY (ROUTINE TESTING W REFLEX): HIV 1&2 Ab, 4th Generation: NONREACTIVE

## 2020-02-19 ENCOUNTER — Telehealth: Payer: Self-pay | Admitting: Family Medicine

## 2020-02-19 ENCOUNTER — Other Ambulatory Visit: Payer: Self-pay | Admitting: Family Medicine

## 2020-02-19 MED ORDER — METFORMIN HCL ER 500 MG PO TB24: 1000.0000 mg | ORAL_TABLET | Freq: Two times a day (BID) | ORAL | 5 refills | Status: DC

## 2020-02-19 NOTE — Telephone Encounter (Signed)
Please call patient and tell him that it is common to have a localized reaction with a vaccine. Can apply heat. If any drainage, streaking, increased pain, difficulty moving arm, new numbness or tingling, needs to follow up.  I have discontinued the two medications that were too expensive from his list. I have sent in an extended release metformin for him to take 2 tablets twice a day with meals. If this is not bringing down his blood sugars in the next couple of weeks, will need to consider increasing his glimepiride.

## 2020-02-19 NOTE — Telephone Encounter (Signed)
Mr. Spychalski notified as instructed by telephone.  Patient states understanding.

## 2020-02-19 NOTE — Telephone Encounter (Signed)
Patient called today in regards to Metformin HCL and his Pneumonia Vaccine he received.   Patient stated that he went to the pharmacy to pick up the Metformin HCL and it was going to cost him $2200. He spoke his drug plan and they stated they do not cover the Metformin HCL but will cover the regular metformin. Patient would like to know if he can be switched to the regular metformin if possible.   Patient also stated that the dapagliflozin propanediol (FARXIGA) 5 MG TABS tablet will cost him over $400 and wanted to know if there is an alternative     Patient also called about the pneumonia vaccine. He stated he received this on 5/19 at his visit. And noticed yesterday it was really swollen at the injection site. Today he stated it is not as swollen but it is very red. He wanted to know if this is normal for the site to do or something he should be concerned with. Patient said he's had several other vaccines and they have never done this.

## 2020-08-13 ENCOUNTER — Other Ambulatory Visit: Payer: Self-pay | Admitting: Family Medicine

## 2020-11-03 ENCOUNTER — Other Ambulatory Visit: Payer: Self-pay | Admitting: Family Medicine

## 2020-11-03 NOTE — Telephone Encounter (Signed)
Called patient to schedule DM follow up. LVM to call back and schedule.

## 2020-11-03 NOTE — Telephone Encounter (Signed)
Pt needs a DM follow-up appt scheduled please. Thanks for everything.

## 2020-11-04 NOTE — Telephone Encounter (Signed)
Called patient to schedule DM follow up. Scheduled 2/14.

## 2020-11-14 ENCOUNTER — Other Ambulatory Visit: Payer: Self-pay

## 2020-11-14 ENCOUNTER — Other Ambulatory Visit: Payer: Self-pay | Admitting: Family Medicine

## 2020-11-14 ENCOUNTER — Encounter: Payer: Self-pay | Admitting: Family Medicine

## 2020-11-14 ENCOUNTER — Ambulatory Visit (INDEPENDENT_AMBULATORY_CARE_PROVIDER_SITE_OTHER): Payer: Medicare Other | Admitting: Family Medicine

## 2020-11-14 VITALS — BP 128/78 | HR 86 | Temp 98.1°F | Ht 72.0 in | Wt 298.0 lb

## 2020-11-14 DIAGNOSIS — E119 Type 2 diabetes mellitus without complications: Secondary | ICD-10-CM | POA: Diagnosis not present

## 2020-11-14 DIAGNOSIS — E7849 Other hyperlipidemia: Secondary | ICD-10-CM | POA: Diagnosis not present

## 2020-11-14 DIAGNOSIS — I10 Essential (primary) hypertension: Secondary | ICD-10-CM | POA: Diagnosis not present

## 2020-11-14 LAB — POCT GLYCOSYLATED HEMOGLOBIN (HGB A1C): Hemoglobin A1C: 8.1 % — AB (ref 4.0–5.6)

## 2020-11-14 MED ORDER — ATORVASTATIN CALCIUM 20 MG PO TABS: 20.0000 mg | ORAL_TABLET | Freq: Every evening | ORAL | 3 refills | Status: DC

## 2020-11-14 MED ORDER — LISINOPRIL 20 MG PO TABS: 20.0000 mg | ORAL_TABLET | Freq: Every day | ORAL | 3 refills | Status: DC

## 2020-11-14 MED ORDER — GLIMEPIRIDE 4 MG PO TABS: 4.0000 mg | ORAL_TABLET | Freq: Every day | ORAL | 3 refills | Status: DC

## 2020-11-14 NOTE — Assessment & Plan Note (Signed)
Poorly controlled, though improved from prior. Discussed need for 3rd agent. He will call insurance to get cost comparison and follow-up. Discussed Trulicity or ozempic injectable would be preference to help with weight loss.

## 2020-11-14 NOTE — Patient Instructions (Addendum)
-   DDP-4 - Januvia (oral)  - SGLT-2 Inhibitors (Empagliflozin and Canagliflozin) (oral)    - GLP-1 Agonists (Liraglutide, dulaglutide, semaglutide) -- Injectable medications - often help with weight loss -- This is my first choice if willing to try   Continue to work on diet    Check with insurance -- send mychart in 1-3 days to let me know what you want to try if no good option will plan pharmacy referral

## 2020-11-14 NOTE — Assessment & Plan Note (Signed)
Continue lipitor 20 mg. Cont weight loss efforts and healthy diet

## 2020-11-14 NOTE — Assessment & Plan Note (Signed)
BP at goal. Cont lisinopril 20 mg 

## 2020-11-14 NOTE — Progress Notes (Signed)
Subjective:     James Washington is a 65 y.o. male presenting for Follow-up (DM )     HPI  #Diabetes Currently taking metformin and glimepiride  Using medications without difficulties: Yes Hypoglycemic episodes:No  Hyperglycemic episodes:No  Feet problems:dry and cracked Blood Sugars averaging: ranges 120-170 Last HgbA1c:  Lab Results  Component Value Date   HGBA1C 8.1 (A) 11/14/2020   Diet: not as good as it could be  Diabetes Health Maintenance Due:    Diabetes Health Maintenance Due  Topic Date Due  . OPHTHALMOLOGY EXAM  Never done  . FOOT EXAM  02/16/2021  . HEMOGLOBIN A1C  05/14/2021      Review of Systems   Social History   Tobacco Use  Smoking Status Current Some Day Smoker  . Years: 10.00  . Types: Cigars  Smokeless Tobacco Never Used  Tobacco Comment    once a month        Objective:    BP Readings from Last 3 Encounters:  11/14/20 128/78  02/17/20 138/76  11/26/19 138/72   Wt Readings from Last 3 Encounters:  11/14/20 298 lb (135.2 kg)  02/17/20 (!) 301 lb (136.5 kg)  11/26/19 297 lb 8 oz (134.9 kg)    BP 128/78   Pulse 86   Temp 98.1 F (36.7 C) (Temporal)   Ht 6' (1.829 m)   Wt 298 lb (135.2 kg)   SpO2 96%   BMI 40.42 kg/m    Physical Exam Constitutional:      Appearance: Normal appearance. He is not ill-appearing or diaphoretic.  HENT:     Right Ear: External ear normal.     Left Ear: External ear normal.     Nose: Nose normal.  Eyes:     General: No scleral icterus.    Extraocular Movements: Extraocular movements intact.     Conjunctiva/sclera: Conjunctivae normal.  Cardiovascular:     Rate and Rhythm: Normal rate and regular rhythm.     Heart sounds: No murmur heard.   Pulmonary:     Effort: Pulmonary effort is normal. No respiratory distress.     Breath sounds: Normal breath sounds. No wheezing.  Musculoskeletal:     Cervical back: Neck supple.  Skin:    General: Skin is warm and dry.   Neurological:     Mental Status: He is alert. Mental status is at baseline.  Psychiatric:        Mood and Affect: Mood normal.           Assessment & Plan:   Problem List Items Addressed This Visit      Cardiovascular and Mediastinum   HTN (hypertension)    BP at goal. Cont lisinopril 20 mg      Relevant Medications   atorvastatin (LIPITOR) 20 MG tablet   lisinopril (ZESTRIL) 20 MG tablet     Endocrine   Type 2 diabetes mellitus (HCC) - Primary    Poorly controlled, though improved from prior. Discussed need for 3rd agent. He will call insurance to get cost comparison and follow-up. Discussed Trulicity or ozempic injectable would be preference to help with weight loss.       Relevant Medications   atorvastatin (LIPITOR) 20 MG tablet   glimepiride (AMARYL) 4 MG tablet   lisinopril (ZESTRIL) 20 MG tablet   Other Relevant Orders   POCT glycosylated hemoglobin (Hb A1C) (Completed)     Other   Hyperlipidemia    Continue lipitor 20 mg. Cont weight loss efforts  and healthy diet      Relevant Medications   atorvastatin (LIPITOR) 20 MG tablet   lisinopril (ZESTRIL) 20 MG tablet       Return in about 3 months (around 02/11/2021) for diabetes.  Lynnda Child, MD  This visit occurred during the SARS-CoV-2 public health emergency.  Safety protocols were in place, including screening questions prior to the visit, additional usage of staff PPE, and extensive cleaning of exam room while observing appropriate contact time as indicated for disinfecting solutions.

## 2020-12-15 ENCOUNTER — Encounter: Payer: Self-pay | Admitting: Family Medicine

## 2020-12-15 DIAGNOSIS — E1169 Type 2 diabetes mellitus with other specified complication: Secondary | ICD-10-CM

## 2020-12-20 ENCOUNTER — Other Ambulatory Visit: Payer: Self-pay

## 2020-12-20 ENCOUNTER — Encounter: Payer: Medicare Other | Attending: Family Medicine | Admitting: Dietician

## 2020-12-20 ENCOUNTER — Encounter: Payer: Self-pay | Admitting: Dietician

## 2020-12-20 DIAGNOSIS — E119 Type 2 diabetes mellitus without complications: Secondary | ICD-10-CM | POA: Diagnosis present

## 2020-12-20 NOTE — Patient Instructions (Signed)
Continue your physical activity! You are doing a great job! Consider upping your activity to 30-40 minutes one or two times a week.  Check your blood sugar fasting and 2 hours after a meal!  Begin to recognize carbohydrates in your food choices!  Work towards eating three meals a day, about 5-6 hours apart!  Have 4 carb choices at each meal (60 g).   Begin to build your meals using the proportions of the Balanced Plate. . First, select your carb choice(s) for the meal, and determine how much you should have to equal 4 carb choices (60 g). . Next, select your source of protein to pair with your carb choice(s). . Finally, complete the remaining half of your meal with a variety of non-starchy vegetables.

## 2020-12-20 NOTE — Progress Notes (Signed)
Diabetes Self-Management Education  Visit Type: First/Initial  Appt. Start Time: 0915 Appt. End Time: 1020  12/20/2020  [name], identified by name and date of birth, is a 65 y.o. male with a diagnosis of Diabetes: Type 2.   ASSESSMENT  Pt has been diabetic for over 20 years. Pt reports going to the doctor and was recommended another medication. Pt reports deciding to attempt diet and lifestyle change first. Pt reports checking their blood sugar inconsistently. Pt reports checking their BG when they feel odd (sweaty, lightheaded). Pt states this was associated with a low. Pt reports these symptoms are associated with BG values 90-100,and they will eat some carbs to bring it back up  Pt reports never being a big fan of sweets. Pt reports their problem is portion control, fried foods, and starchy foods. Pt reports trying to get away from sugar free lemonade and is using lemon juice in water instead. Pt reports times of cravings when "being good" and will over consume. Pt reports craving salty foods frequently. Pt reports 16-24 oz of water. Pt reports recently developing a shrimp allergy in the last 2 years, breaks out in hives.   There were no vitals taken for this visit. There is no height or weight on file to calculate BMI.   Diabetes Self-Management Education - 12/20/20 0932      Visit Information   Visit Type First/Initial      Initial Visit   Diabetes Type Type 2    Are you currently following a meal plan? No    Are you taking your medications as prescribed? Yes    Date Diagnosed 20 years      Health Coping   How would you rate your overall health? Fair      Psychosocial Assessment   Patient Belief/Attitude about Diabetes Motivated to manage diabetes    Self-management support Family;Doctor's office    Other persons present Patient    Patient Concerns Glycemic Control;Nutrition/Meal planning    Special Needs None    Preferred Learning Style No preference indicated     Learning Readiness Ready    How often do you need to have someone help you when you read instructions, pamphlets, or other written materials from your doctor or pharmacy? 1 - Never    What is the last grade level you completed in school? Bachelor's degree      Pre-Education Assessment   Patient understands the diabetes disease and treatment process. Needs Instruction    Patient understands incorporating nutritional management into lifestyle. Needs Instruction    Patient undertands incorporating physical activity into lifestyle. Needs Instruction    Patient understands using medications safely. Needs Instruction    Patient understands monitoring blood glucose, interpreting and using results Needs Instruction    Patient understands prevention, detection, and treatment of acute complications. Needs Instruction    Patient understands prevention, detection, and treatment of chronic complications. Needs Instruction    Patient understands how to develop strategies to address psychosocial issues. Needs Instruction    Patient understands how to develop strategies to promote health/change behavior. Needs Instruction      Complications   Last HgB A1C per patient/outside source 8.7 %    How often do you check your blood sugar? 1-2 times/day   Pt reports inconsistency in checking   Fasting Blood glucose range (mg/dL) 022-336    Number of hypoglycemic episodes per month 1    Can you tell when your blood sugar is low? Yes  What do you do if your blood sugar is low? Eat a carb    Have you had a dilated eye exam in the past 12 months? Yes    Have you had a dental exam in the past 12 months? Yes    Are you checking your feet? Yes    How many days per week are you checking your feet? 7      Dietary Intake   Breakfast Sauasge roll, 1/2 banana, black coffee    Snack (morning) none    Lunch [employer] chicken kabob, pita bread, salad, unsweet tea    Snack (afternoon) 1 apple    Dinner Salad with peppers,  olives, tomatoes, 1/2 fried chicken thigh, oil and vinegar dressing    Snack (evening) 3 sugar free popsicles,    Beverage(s) coffee, unsweet tea, water      Exercise   Exercise Type ADL's;Light (walking / raking leaves)    How many days per week to you exercise? 6    How many minutes per day do you exercise? 30    Total minutes per week of exercise 180      Patient Education   Previous Diabetes Education Yes (please comment)   Diabetes info pamphlets   Disease state  Definition of diabetes, type 1 and 2, and the diagnosis of diabetes;Factors that contribute to the development of diabetes    Nutrition management  Carbohydrate counting;Food label reading, portion sizes and measuring food.;Role of diet in the treatment of diabetes and the relationship between the three main macronutrients and blood glucose level;Meal options for control of blood glucose level and chronic complications.    Physical activity and exercise  Role of exercise on diabetes management, blood pressure control and cardiac health.;Helped patient identify appropriate exercises in relation to his/her diabetes, diabetes complications and other health issue.    Medications Reviewed patients medication for diabetes, action, purpose, timing of dose and side effects.    Monitoring Purpose and frequency of SMBG.;Taught/discussed recording of test results and interpretation of SMBG.    Acute complications Taught treatment of hypoglycemia - the 15 rule.    Chronic complications Relationship between chronic complications and blood glucose control    Psychosocial adjustment Role of stress on diabetes;Identified and addressed patients feelings and concerns about diabetes;Helped patient identify a support system for diabetes management    Personal strategies to promote health Lifestyle issues that need to be addressed for better diabetes care      Individualized Goals (developed by patient)   Nutrition Follow meal plan discussed     Physical Activity Exercise 3-5 times per week    Medications take my medication as prescribed    Monitoring  test my blood glucose as discussed      Post-Education Assessment   Patient understands the diabetes disease and treatment process. Needs Review    Patient understands incorporating nutritional management into lifestyle. Needs Review    Patient undertands incorporating physical activity into lifestyle. Needs Review    Patient understands using medications safely. Needs Review    Patient understands monitoring blood glucose, interpreting and using results Needs Review    Patient understands prevention, detection, and treatment of acute complications. Needs Review    Patient understands prevention, detection, and treatment of chronic complications. Needs Review    Patient understands how to develop strategies to address psychosocial issues. Needs Review    Patient understands how to develop strategies to promote health/change behavior. Needs Review      Outcomes  Expected Outcomes Demonstrated interest in learning. Expect positive outcomes    Future DMSE 4-6 wks    Program Status Not Completed           Individualized Plan for Diabetes Self-Management Training:   Learning Objective:  Patient will have a greater understanding of diabetes self-management. Patient education plan is to attend individual and/or group sessions per assessed needs and concerns.   Plan:   Patient Instructions  Continue your physical activity! You are doing a great job! Consider upping your activity to 30-40 minutes one or two times a week.  Check your blood sugar fasting and 2 hours after a meal!  Begin to recognize carbohydrates in your food choices!  Work towards eating three meals a day, about 5-6 hours apart!  Have 4 carb choices at each meal (60 g).   Begin to build your meals using the proportions of the Balanced Plate. . First, select your carb choice(s) for the meal, and determine how  much you should have to equal 4 carb choices (60 g). . Next, select your source of protein to pair with your carb choice(s). . Finally, complete the remaining half of your meal with a variety of non-starchy vegetables.     Expected Outcomes:  Demonstrated interest in learning. Expect positive outcomes  Education material provided: Meal plan card, My Plate and Snack sheet  If problems or questions, patient to contact team via:  Phone and Email  Future DSME appointment: 4-6 wks

## 2021-01-17 ENCOUNTER — Encounter: Payer: Self-pay | Admitting: Family Medicine

## 2021-01-17 LAB — HM DIABETES EYE EXAM

## 2021-01-18 ENCOUNTER — Encounter: Payer: Self-pay | Admitting: Family Medicine

## 2021-02-01 ENCOUNTER — Telehealth: Payer: Self-pay | Admitting: *Deleted

## 2021-02-01 ENCOUNTER — Ambulatory Visit: Payer: Medicare Other | Admitting: Dietician

## 2021-02-01 NOTE — Telephone Encounter (Signed)
Patient called stating that he was out of town in [place] over the weekend for a wedding.Patient stated that he wore a mask on the plane but no one wore one at the wedding. Patient stated that he got home Monday. Patient stated that he started with a scratchy throat and runny nose today. Patient stated that he just took a home covid test and it was negative. Patient stated that he has been vaccinated and has had his booster covid vaccine. Patient stated that he really does not feel that bad but is concerned about his wife because she can not take the vaccines. Patient stated that his wife does not come here to the office. Patient was advised that she should call her doctor and discuss her concerns. Patient was advised to quarantine, rest, drink lots of fluids and eat well-balanced meals. Patient was offered a virtual visit which he declined stating that he does not really feel that bad.  Patient was given ER precautions and he verbalized understanding.

## 2021-02-02 ENCOUNTER — Telehealth (INDEPENDENT_AMBULATORY_CARE_PROVIDER_SITE_OTHER): Payer: Medicare Other | Admitting: Adult Health

## 2021-02-02 ENCOUNTER — Encounter: Payer: Self-pay | Admitting: Adult Health

## 2021-02-02 VITALS — Ht 72.01 in | Wt 275.0 lb

## 2021-02-02 DIAGNOSIS — R0981 Nasal congestion: Secondary | ICD-10-CM | POA: Insufficient documentation

## 2021-02-02 DIAGNOSIS — U071 COVID-19: Secondary | ICD-10-CM

## 2021-02-02 DIAGNOSIS — R0982 Postnasal drip: Secondary | ICD-10-CM | POA: Insufficient documentation

## 2021-02-02 HISTORY — DX: COVID-19: U07.1

## 2021-02-02 NOTE — Patient Instructions (Signed)

## 2021-02-02 NOTE — Telephone Encounter (Signed)
Noted agree with wife contacting her provider.

## 2021-02-02 NOTE — Progress Notes (Signed)
Virtual Visit via telephone visit   I connected with James Washington on 02/02/21 at  1:30 PM EDT by a telephone  enabled telemedicine application and verified that I am speaking with the correct person using two identifiers. He was unable to get the video to work.    Parties involved in visit as below:   Location: Patient: at home  Provider: Provider: Provider's office at  [facility], [place].     I discussed the limitations of evaluation and management by telemedicine and the availability of in person appointments. The patient expressed understanding and agreed to proceed.  History of Present Illness: Patient tested positive on 02/01/21 with at home covid test. Has sore throat congestion in head. Slight cough. Onset was 02/01/21. He did run fever last night and had some nasal congestion. He did not take temperature. He did purchase thermometer today and had no temperature. Cough from post nasal drip.  Denies any shortness of breath  He did get sudafed has not taken yet.  Denies any exposures. His wife was not able to have the vaccine due to serious reaction.  Patient  denies any fever, body aches,chills, rash, chest pain, shortness of breath, nausea, vomiting, or diarrhea.  Denies dizziness, lightheadedness, pre syncopal or syncopal episodes.     Observations/Objective:  Patient is alert and oriented and responsive to questions Engages in conversation with provider. Speaks in full sentences without any pauses without any shortness of breath or distress.   Patient  denies any fever, body aches,chills, rash, chest pain, shortness of breath, nausea, vomiting, or diarrhea.  Denies dizziness, lightheadedness, pre syncopal or syncopal episodes.    Assessment and Plan: COVID-19  Post-nasal drainage  Nasal congestion  Symptomatic treatment discussed, would advise Zyrtec in the morning per package, benadryl ok at night for head nasal congestion.   Also advise mucinex per package with increased fluids.  Delsym for cough is ok per package if develops.  Walking and getting up out of bed as well as deep breathing advised to exercise lungs. Continue asa 81 mg daily that he is currently on.   He declined monoclonal antibody infusion referral .  Advised in person evaluation at anytime is advised if any symptoms do not improve, worsen or change at any given time.  Red Flags discussed. The patient was given clear instructions to go to ER or return to medical center if any red flags develop, symptoms do not improve, worsen or new problems develop. They verbalized understanding.  Follow Up Instructions:    I discussed the assessment and treatment plan with the patient. The patient was provided an opportunity to ask questions and all were answered. The patient agreed with the plan and demonstrated an understanding of the instructions.   The patient was advised to call back or seek an in-person evaluation if the symptoms worsen or if the condition fails to improve as anticipated.  Advised in person evaluation at anytime is advised if any symptoms do not improve, worsen or change at any given time.  Red Flags discussed. The patient was given clear instructions to go to ER or return to medical center if any red flags develop, symptoms do not improve, worsen or new problems develop. They verbalized understanding.  Return in about 3 days (around 02/05/2021), or if symptoms worsen or fail to improve, for at any time for any worsening symptoms, Go to Emergency room/ urgent care if worse. 25 minutes spent with telephone visit.  Beverely Pace  James Amos, FNP

## 2021-02-10 NOTE — Progress Notes (Signed)
Forwarding to PCP.

## 2021-02-12 ENCOUNTER — Other Ambulatory Visit: Payer: Self-pay | Admitting: Family Medicine

## 2021-02-20 ENCOUNTER — Ambulatory Visit: Payer: Medicare Other | Admitting: Family Medicine

## 2021-02-22 ENCOUNTER — Ambulatory Visit: Payer: Medicare Other | Admitting: Family Medicine

## 2021-03-01 ENCOUNTER — Encounter: Payer: Medicare Other | Attending: Family Medicine | Admitting: Dietician

## 2021-03-01 ENCOUNTER — Other Ambulatory Visit: Payer: Self-pay

## 2021-03-01 DIAGNOSIS — E119 Type 2 diabetes mellitus without complications: Secondary | ICD-10-CM | POA: Insufficient documentation

## 2021-03-01 NOTE — Patient Instructions (Addendum)
Continue the good work! Continue the healthy weight loss at around 2 pounds a week.  When you are feeling more hungry than usual, work to ensure you are having more nutrient dense foods like whole grains, lean protiens, fruits, and vegetables.  Choose baby swiss, or reduced fat cheeses instead of american cheese.

## 2021-03-01 NOTE — Progress Notes (Signed)
Diabetes Self-Management Education  Visit Type: Follow-up  Appt. Start Time: 1145 Appt. End Time: 1220  03/01/2021  [name], identified by name and date of birth, is a 65 y.o. male with a diagnosis of Diabetes: Type 2.   ASSESSMENT  There were no vitals taken for this visit. There is no height or weight on file to calculate BMI.   Pt will be getting their A1c checked tomorrow. Pt reports losing close to 35 pounds over the last 2-3 months. 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 Pt has been playing golf a lot for activity, but want to do more. Pt has been trying to walk when they are at home on days they are not playing golf or doing yardwork. Pt travelled to a wedding in [place] last month and caught Covid on the way back. Pt reports mild symptoms and made a quick recovery. Pt reports still getting slight symptoms of hypoglycemia occasionally, but has only felt that closer to 70 mg/dL now, as opposed to 90 mg/dL at the time of our first visit.  Pt is checking BG more consistently and is logging BG values in their phone, RD checked log. Fasting values are ~150 , PPBG ~120-160. Pt reports dietary changes include blueberries and cottage cheese for breakfast, light lunch with water, and chick-fil-a salads for dinner. Pt reports a noticeable increase in appetite in the past week, and is concerned they may eat too much.   Diabetes Self-Management Education - 03/01/21 1150      Visit Information   Visit Type Follow-up      Initial Visit   Diabetes Type Type 2    Are you taking your medications as prescribed? Yes      Complications   How often do you check your blood sugar? 3-4 times/day    Fasting Blood glucose range (mg/dL) 132-440    Postprandial Blood glucose range (mg/dL) 102-725    Number of hypoglycemic episodes per month 4     Can you tell when your blood sugar is low? Yes    What do you do if your blood sugar is low? eats bread or a sandwich to bring it back up,  states they start keeping juice to use      Dietary Intake   Breakfast Sausage biscuit, 3 cups of coffee    Snack (morning) 3 diet sodas while playing golf    Lunch Slim [employer], cheese stick, beer    Snack (afternoon) 1 slice of pizza    Dinner Hamburger, mixed greens salad w/ carrots, celery, tomatoes, vinegarette, bottle of water    Snack (evening) pimento cheese, ~5 saltines    Beverage(s) beer, 3 diet sodas, water      Exercise   Exercise Type ADL's;Light (walking / raking leaves)      Patient Self-Evaluation of Goals - Patient rates self as meeting previously set goals (% of time)   Nutrition 50 - 75 %    Physical Activity 25 - 50%    Medications >75%    Monitoring >75%    Problem Solving 50 - 75 %    Reducing Risk 25 - 50%    Health Coping 25 - 50%      Post-Education Assessment   Patient understands the diabetes disease and treatment process. Demonstrates understanding / competency  Patient understands incorporating nutritional management into lifestyle. Needs Review    Patient undertands incorporating physical activity into lifestyle. Needs Review    Patient understands using medications safely. Demonstrates understanding / competency    Patient understands monitoring blood glucose, interpreting and using results Needs Review    Patient understands prevention, detection, and treatment of acute complications. Demonstrates understanding / competency    Patient understands prevention, detection, and treatment of chronic complications. Needs Review    Patient understands how to develop strategies to address psychosocial issues. Needs Review    Patient understands how to develop strategies to promote health/change behavior. Needs Review      Outcomes   Expected Outcomes Demonstrated interest in learning. Expect positive outcomes     Future DMSE 3-4 months    Program Status Not Completed      Subsequent Visit   Since your last visit have you continued or begun to take your medications as prescribed? Yes    Since your last visit have you had your blood pressure checked? No    Since your last visit have you experienced any weight changes? No change    Since your last visit, are you checking your blood glucose at least once a day? Yes           Individualized Plan for Diabetes Self-Management Training:   Learning Objective:  Patient will have a greater understanding of diabetes self-management. Patient education plan is to attend individual and/or group sessions per assessed needs and concerns.   Plan:   Patient Instructions  Continue the good work! Continue the healthy weight loss at around 2 pounds a week.  When you are feeling more hungry than usual, work to ensure you are having more nutrient dense foods like whole grains, lean protiens, fruits, and vegetables.  Choose baby swiss, or reduced fat cheeses instead of american cheese.     Expected Outcomes:  Demonstrated interest in learning. Expect positive outcomes  Education material provided: Balanced plate food list, Ranch-chive popcorn recipe  If problems or questions, patient to contact team via:  Phone and Email  Future DSME appointment: 3-4 months

## 2021-03-02 ENCOUNTER — Ambulatory Visit (INDEPENDENT_AMBULATORY_CARE_PROVIDER_SITE_OTHER): Payer: Medicare Other | Admitting: Family Medicine

## 2021-03-02 ENCOUNTER — Other Ambulatory Visit: Payer: Self-pay

## 2021-03-02 VITALS — BP 122/72 | HR 77 | Temp 97.4°F | Ht 72.0 in | Wt 276.5 lb

## 2021-03-02 DIAGNOSIS — E785 Hyperlipidemia, unspecified: Secondary | ICD-10-CM

## 2021-03-02 DIAGNOSIS — E7849 Other hyperlipidemia: Secondary | ICD-10-CM

## 2021-03-02 DIAGNOSIS — R198 Other specified symptoms and signs involving the digestive system and abdomen: Secondary | ICD-10-CM | POA: Diagnosis not present

## 2021-03-02 DIAGNOSIS — E1169 Type 2 diabetes mellitus with other specified complication: Secondary | ICD-10-CM

## 2021-03-02 DIAGNOSIS — I10 Essential (primary) hypertension: Secondary | ICD-10-CM

## 2021-03-02 LAB — POCT GLYCOSYLATED HEMOGLOBIN (HGB A1C): Hemoglobin A1C: 6.8 % — AB (ref 4.0–5.6)

## 2021-03-02 NOTE — Progress Notes (Signed)
Subjective:     James Washington is a 65 y.o. 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 HEMOGLOBIN A1C  09/01/2021  . OPHTHALMOLOGY EXAM  01/17/2022  . FOOT EXAM  03/02/2022    #HTN - taking lisinopril -    #bowel movements - smaller in size - had been irregular - now more regular - improved over the last week - darker in color - eating blueberries more in the AM - hx of hemorrhoids - constipation - harder to pass - colonoscopy 2018 - 10 yr f/u - thinner in size  Review of Systems   Social History   Tobacco Use  Smoking Status Former Smoker  . Years: 10.00  . Types: Cigars  Smokeless Tobacco Never Used  Tobacco Comment    once a month        Objective:    BP Readings from Last 3 Encounters:  03/02/21 122/72  11/14/20 128/78  02/17/20 138/76   Wt Readings from Last 3 Encounters:  03/02/21 276 lb 8 oz (125.4 kg)  02/02/21 275 lb (124.7 kg)  11/14/20 298 lb (135.2 kg)    BP 122/72   Pulse 77   Temp (!) 97.4 F (36.3 C) (Temporal)   Ht 6' (1.829 m)   Wt 276 lb 8 oz (125.4 kg)   SpO2 96%   BMI 37.50 kg/m    Physical Exam Exam conducted with a chaperone present.  Constitutional:      Appearance: Normal appearance. He is not ill-appearing or diaphoretic.  HENT:     Right Ear: External ear normal.     Left Ear: External ear normal.  Eyes:     General: No scleral icterus.    Extraocular Movements: Extraocular  movements intact.     Conjunctiva/sclera: Conjunctivae normal.  Cardiovascular:     Rate and Rhythm: Normal rate.  Pulmonary:     Effort: Pulmonary effort is normal.  Genitourinary:    Rectum: Normal. No mass, anal fissure, external hemorrhoid or internal hemorrhoid.  Musculoskeletal:     Cervical back: Neck supple.  Skin:    General: Skin is warm and dry.  Neurological:     Mental Status: He is alert. Mental status is at baseline.  Psychiatric:        Mood and Affect: Mood normal.           Assessment & Plan:   Problem List Items Addressed This Visit      Cardiovascular and Mediastinum   HTN (hypertension)    BP controlled. Cont lisinopril 20 mg      Relevant Orders   Comprehensive metabolic panel     Endocrine   Type 2 diabetes mellitus (HCC) - Primary    Lab Results  Component Value Date   HGBA1C 6.8 (A) 03/02/2021   Improvement with weight loss and diet changes. Cont glimepiride 4 mg and metformin 1000 mg bid. Cont healthy diet       Relevant Orders  POCT glycosylated hemoglobin (Hb A1C) (Completed)   Comprehensive metabolic panel   Hyperlipidemia associated with type 2 diabetes mellitus [facility])    Lab Results  Component Value Date   LDLCALC 47 02/17/2020   At goal. Cont atorvastatin 20 mg        Other   Change in bowel movement    Reassurance. Colonoscopy normal in 2018. Normal rectal exam today. Constipation treatment but if no improvement in 1 month will get FOBT.           Return in about 4 months (around 07/02/2021) for Annual exam.  Lynnda Child, MD  This visit occurred during the SARS-CoV-2 public health emergency.  Safety protocols were in place, including screening questions prior to the visit, additional usage of staff PPE, and extensive cleaning of exam room while observing appropriate contact time as indicated for disinfecting solutions.

## 2021-03-02 NOTE — Assessment & Plan Note (Signed)
Lab Results  Component Value Date   LDLCALC 47 02/17/2020   At goal. Cont atorvastatin 20 mg

## 2021-03-02 NOTE — Assessment & Plan Note (Signed)
Lab Results  Component Value Date   HGBA1C 6.8 (A) 03/02/2021   Improvement with weight loss and diet changes. Cont glimepiride 4 mg and metformin 1000 mg bid. Cont healthy diet

## 2021-03-02 NOTE — Assessment & Plan Note (Signed)
BP controlled. Cont lisinopril 20 mg 

## 2021-03-02 NOTE — Patient Instructions (Addendum)
Constipation   Constipation is a common issue. Often it is related to diet and occasionally medications.    What you can do to treat your symptoms 1) Fiber -- Eat more fiber rich foods: beans, broccoli, berries, avocados, popcorn, pear/apple, green peas, turnip greens, brussels sprouts, whole grains (barley, bran, quinoa, oatmeal) -- Take a Fiber supplement: Psyllium (Metamucil)  -- Could also eat Prunes daily  2) Hydration  -- Drink more water: Try to drink 64 oz of water per day  3) Exercise -- Moderate exercise (walking, jogging, biking) for 30 minutes, 5 days a week  4) Dedicate time for Bowel movements - do not delay  5) Stool Softener  - Docusate Sodium (Colace) 100 mg daily or twice daily as needed   If no improvement - call and will plan for stool test and GI referral  Treating chronic constipation is often about finding the right amount of medication and fiber to keep you regular and comfortable. For some people that may be daily metamucil and colace every other day. For others it may be Metamucil and colace twice daily and Miralax 3 times a week. The goal is to go slow and listen to your body. And normal can be anywhere from 2-3 soft bowel movements a day to 1 bowel movement every 2-3 days.

## 2021-03-02 NOTE — Assessment & Plan Note (Signed)
Reassurance. Colonoscopy normal in 2018. Normal rectal exam today. Constipation treatment but if no improvement in 1 month will get FOBT.

## 2021-03-03 LAB — LIPID PANEL
Cholesterol: 102 mg/dL (ref 0–200)
HDL: 45.7 mg/dL (ref >39.00)
LDL Cholesterol: 36 mg/dL (ref 0–99)
NonHDL: 55.86
Total CHOL/HDL Ratio: 2
Triglycerides: 100 mg/dL (ref 0.0–149.0)
VLDL: 20 mg/dL (ref 0.0–40.0)

## 2021-03-03 LAB — COMPREHENSIVE METABOLIC PANEL
ALT: 17 U/L (ref 0–53)
AST: 16 U/L (ref 0–37)
Albumin: 4.3 g/dL (ref 3.5–5.2)
Alkaline Phosphatase: 78 U/L (ref 39–117)
BUN: 21 mg/dL (ref 6–23)
CO2: 26 mEq/L (ref 19–32)
Calcium: 9.6 mg/dL (ref 8.4–10.5)
Chloride: 102 mEq/L (ref 96–112)
Creatinine, Ser: 0.92 mg/dL (ref 0.40–1.50)
GFR: 86.7 mL/min (ref >60.00)
Glucose, Bld: 91 mg/dL (ref 70–99)
Potassium: 4.8 mEq/L (ref 3.5–5.1)
Sodium: 138 mEq/L (ref 135–145)
Total Bilirubin: 0.4 mg/dL (ref 0.2–1.2)
Total Protein: 7.5 g/dL (ref 6.0–8.3)

## 2021-05-17 ENCOUNTER — Encounter: Payer: Self-pay | Admitting: Dietician

## 2021-05-17 ENCOUNTER — Other Ambulatory Visit: Payer: Self-pay

## 2021-05-17 ENCOUNTER — Encounter: Payer: Medicare Other | Attending: Family Medicine | Admitting: Dietician

## 2021-05-17 DIAGNOSIS — E119 Type 2 diabetes mellitus without complications: Secondary | ICD-10-CM | POA: Insufficient documentation

## 2021-05-17 NOTE — Patient Instructions (Addendum)
Have low-fat string cheese for a protein snack.  Check your blood sugar 2 hours after your dinner meal.  Try having some low-fat beef jerky as a snack before bed and see if your fasting glucose numbers begin to lower a little. Read your labels and find options with a low amount of added sugar.  Keep up the great work [name]!!

## 2021-05-17 NOTE — Progress Notes (Signed)
Diabetes Self-Management Education  Visit Type: Follow-up  Appt. Start Time: 1145 Appt. End Time: 1220  05/17/2021  [name], identified by name and date of birth, is a 65 y.o. male with a diagnosis of Diabetes: Type 2.   ASSESSMENT Pt A1c is down to 6.8 from 8.1 as of 03/02/2021. Pt reports not checking their blood sugar as often as before. Pt reports a FBG of around 130-160, and a PPBG of 100-120 2-3 hours after breakfast. Pt reports less instances of hypoglycemia since last visit. Pt has lost 6 pounds in the last two months. Pt reports not being as diligent in their dietary changes as of late, but still trying to eat better. Pt has been pairing their carbs with a protein as much as possible. Pt reports snacking on popcorn to satisfy their salt cravings in the evening. Pt reports appetite is still elevated and feels that they are eating too much. Pt is having a meatless meal once or twice a week now. Pt is playing golf a couple times a week, walks, and does yard work for physical activity.  Height 6\' 1"  (1.854 m), weight 270 lb (122.5 kg). Body mass index is 35.62 kg/m.   Diabetes Self-Management Education - 05/17/21 1200       Visit Information   Visit Type Follow-up      Initial Visit   Diabetes Type Type 2    Are you taking your medications as prescribed? Yes      Complications   Last HgB A1C per patient/outside source 6.8 %   03/02/2021   How often do you check your blood sugar? 1-2 times/day    Fasting Blood glucose range (mg/dL) 05/02/2021    Postprandial Blood glucose range (mg/dL) 268-341      Dietary Intake   Breakfast Blueberries and cottage cheese, 1 piece rye toast with cheese, coffee, water    Lunch 1/2 small frozen pizza, diet coke    Snack (afternoon) 1 string cheese    Dinner BLT on rye, watermelon    Snack (evening) pretzels, oilves, pickles    Beverage(s) coffee, water      Exercise   Exercise Type ADL's;Light (walking / raking leaves)    How many days  per week to you exercise? 4    How many minutes per day do you exercise? 30    Total minutes per week of exercise 120      Patient Self-Evaluation of Goals - Patient rates self as meeting previously set goals (% of time)   Nutrition 25 - 50%    Physical Activity 25 - 50%    Medications >75%    Monitoring >75%    Problem Solving 50 - 75 %    Reducing Risk 25 - 50%    Health Coping 25 - 50%      Post-Education Assessment   Patient understands the diabetes disease and treatment process. Demonstrates understanding / competency    Patient understands incorporating nutritional management into lifestyle. Needs Review    Patient undertands incorporating physical activity into lifestyle. Needs Review    Patient understands using medications safely. Demonstrates understanding / competency    Patient understands monitoring blood glucose, interpreting and using results Needs Review    Patient understands prevention, detection, and treatment of acute complications. Demonstrates understanding / competency    Patient understands prevention, detection, and treatment of chronic complications. Needs Review    Patient understands how to develop strategies to address psychosocial issues. Needs Review  Patient understands how to develop strategies to promote health/change behavior. Needs Review      Outcomes   Expected Outcomes Demonstrated interest in learning. Expect positive outcomes    Future DMSE 3-4 months    Program Status Not Completed      Subsequent Visit   Since your last visit have you continued or begun to take your medications as prescribed? Yes    Since your last visit have you had your blood pressure checked? Yes    Is your most recent blood pressure lower, unchanged, or higher since your last visit? Lower    Since your last visit have you experienced any weight changes? Loss    Weight Loss (lbs) 6    Since your last visit, are you checking your blood glucose at least once a day? Yes              Individualized Plan for Diabetes Self-Management Training:   Learning Objective:  Patient will have a greater understanding of diabetes self-management. Patient education plan is to attend individual and/or group sessions per assessed needs and concerns.   Plan:   Patient Instructions  Have low-fat string cheese for a protein snack.  Check your blood sugar 2 hours after your dinner meal.  Try having some low-fat beef jerky as a snack before bed and see if your fasting glucose numbers begin to lower a little. Read your labels and find options with a low amount of added sugar.  Keep up the great work [name]!!  Expected Outcomes:  Demonstrated interest in learning. Expect positive outcomes  If problems or questions, patient to contact team via:  Phone and Email  Future DSME appointment: 3-4 months

## 2021-05-31 ENCOUNTER — Ambulatory Visit: Payer: Medicare Other | Admitting: Dietician

## 2021-07-03 ENCOUNTER — Other Ambulatory Visit: Payer: Self-pay

## 2021-07-03 ENCOUNTER — Ambulatory Visit (INDEPENDENT_AMBULATORY_CARE_PROVIDER_SITE_OTHER): Payer: Medicare Other | Admitting: Family Medicine

## 2021-07-03 ENCOUNTER — Encounter: Payer: Self-pay | Admitting: Family Medicine

## 2021-07-03 VITALS — BP 120/72 | HR 75 | Temp 97.0°F | Ht 72.0 in | Wt 274.5 lb

## 2021-07-03 DIAGNOSIS — E1169 Type 2 diabetes mellitus with other specified complication: Secondary | ICD-10-CM

## 2021-07-03 DIAGNOSIS — E119 Type 2 diabetes mellitus without complications: Secondary | ICD-10-CM

## 2021-07-03 DIAGNOSIS — K219 Gastro-esophageal reflux disease without esophagitis: Secondary | ICD-10-CM

## 2021-07-03 DIAGNOSIS — E785 Hyperlipidemia, unspecified: Secondary | ICD-10-CM

## 2021-07-03 DIAGNOSIS — I10 Essential (primary) hypertension: Secondary | ICD-10-CM

## 2021-07-03 DIAGNOSIS — Z0000 Encounter for general adult medical examination without abnormal findings: Secondary | ICD-10-CM | POA: Diagnosis not present

## 2021-07-03 DIAGNOSIS — Z23 Encounter for immunization: Secondary | ICD-10-CM

## 2021-07-03 LAB — POCT GLYCOSYLATED HEMOGLOBIN (HGB A1C): Hemoglobin A1C: 6.8 % — AB (ref 4.0–5.6)

## 2021-07-03 MED ORDER — METFORMIN HCL ER 500 MG PO TB24: 1000.0000 mg | ORAL_TABLET | Freq: Two times a day (BID) | ORAL | 3 refills | Status: DC

## 2021-07-03 MED ORDER — PANTOPRAZOLE SODIUM 40 MG PO TBEC: 40.0000 mg | DELAYED_RELEASE_TABLET | Freq: Every day | ORAL | 3 refills | Status: DC

## 2021-07-03 NOTE — Assessment & Plan Note (Signed)
Lab Results  Component Value Date   HGBA1C 6.8 (A) 07/03/2021   Well controlled. Cont medication

## 2021-07-03 NOTE — Patient Instructions (Addendum)
Would recommend Shingles and Tdap at the pharmacy

## 2021-07-03 NOTE — Progress Notes (Signed)
Subjective:   James Washington is a 65 y.o. male who presents for an Initial Medicare Annual Wellness Visit.  Review of Systems    Review of Systems  Constitutional:  Negative for chills and fever.  HENT:  Negative for congestion and sore throat.   Eyes:  Negative for blurred vision and double vision.  Respiratory:  Negative for shortness of breath.   Cardiovascular:  Negative for chest pain.  Gastrointestinal:  Negative for heartburn, nausea and vomiting.  Genitourinary: Negative.   Musculoskeletal: Negative.  Negative for myalgias.  Skin:  Negative for rash.  Neurological:  Negative for dizziness and headaches.  Endo/Heme/Allergies:  Does not bruise/bleed easily.  Psychiatric/Behavioral:  Negative for depression. The patient is not nervous/anxious.    Cardiac Risk Factors include: advanced age (>18mn, >>57women);diabetes mellitus;dyslipidemia;male gender;hypertension     Objective:    Today's Vitals   07/03/21 0908  BP: 120/72  Pulse: 75  Temp: (!) 97 F (36.1 C)  TempSrc: Temporal  SpO2: 98%  Weight: 274 lb 8 oz (124.5 kg)  Height: 6' (1.829 m)   Body mass index is 37.23 kg/m.  Advanced Directives 07/03/2021 12/20/2020  Does Patient Have a Medical Advance Directive? No No  Would patient like information on creating a medical advance directive? Yes (MAU/Ambulatory/Procedural Areas - Information given) No - Patient declined    Current Medications (verified) Outpatient Encounter Medications as of 07/03/2021  Medication Sig   aspirin EC 81 MG tablet Take 81 mg by mouth daily.   atorvastatin (LIPITOR) 20 MG tablet Take 1 tablet (20 mg total) by mouth every evening. For cholesterol.   blood glucose meter kit and supplies Check sugar once daily. DX E11.9 Contour Next meter and supplies   glimepiride (AMARYL) 4 MG tablet Take 1 tablet (4 mg total) by mouth daily.   glucose blood (CONTOUR NEXT TEST) test strip Check sugar once daily. DX E11.9   lisinopril (ZESTRIL) 20  MG tablet Take 1 tablet (20 mg total) by mouth daily.   [DISCONTINUED] metFORMIN (GLUCOPHAGE-XR) 500 MG 24 hr tablet TAKE 2 TABLETS (1,000 MG TOTAL) BY MOUTH 2 (TWO) TIMES DAILY WITH A MEAL.   [DISCONTINUED] pantoprazole (PROTONIX) 40 MG tablet TAKE 1 TABLET BY MOUTH EVERY DAY AS NEEDED   metFORMIN (GLUCOPHAGE-XR) 500 MG 24 hr tablet Take 2 tablets (1,000 mg total) by mouth 2 (two) times daily with a meal.   pantoprazole (PROTONIX) 40 MG tablet Take 1 tablet (40 mg total) by mouth daily.   No facility-administered encounter medications on file as of 07/03/2021.    Allergies (verified) Penicillins, Shrimp [shellfish allergy], and Sulfa antibiotics   History: Past Medical History:  Diagnosis Date   Diabetes ([place]    GERD (gastroesophageal reflux disease)    Hypertension    Past Surgical History:  Procedure Laterality Date   NO PAST SURGERIES     Family History  Problem Relation Age of Onset   Lung cancer Mother        small cell   Heart attack Mother 667  Diabetes Father    Heart attack Father 680  Heart attack Maternal Grandfather 570  Diabetes Sister    Heart disease Sister    Heart attack Paternal Grandmother 710  Asthma Paternal Grandmother    Prostate cancer Paternal Grandfather [phone]  Hypothyroidism Sister    Social History   Socioeconomic History   Marital status: Married    Spouse name: MJana Half  Number of children: 1  Years of education: BS from [employer]   Highest education level: Not on file  Occupational History   Occupation: Retired  Tobacco Use   Smoking status: Former    Types: Cigars   Smokeless tobacco: Never   Tobacco comments:     once a month  Vaping Use   Vaping Use: Never used  Substance and Sexual Activity   Alcohol use: Yes    Comment: maybe once a month-beer   Drug use: Never   Sexual activity: Yes    Birth control/protection: Post-menopausal  Other Topics Concern   Not on file  Social History Narrative   11/26/19   From: [place]: with wife Jana Half   Work: retired from [employer]       Family: Daughter - Jinny Blossom (married, 1 son, lives in [place])      Enjoys: golf, fishing, digging for bottles      Exercise: walking - [place] 6-7 days a week   Diet: trying to do better       Safety   Seat belts: Yes    Guns: Yes  and secure   Safe in relationships: Yes    Social Determinants of Radio broadcast assistant Strain: Not on file  Food Insecurity: Not on file  Transportation Needs: Not on file  Physical Activity: Not on file  Stress: Not on file  Social Connections: Not on file    Tobacco Counseling Counseling given: Not Answered Tobacco comments:  once a month   Clinical Intake:  Pre-visit preparation completed: No  Pain : No/denies pain     BMI - recorded: 37.23 Nutritional Status: BMI > 30  Obese Nutritional Risks: None Diabetes: Yes CBG done?: No Did pt. bring in CBG monitor from home?: No (130)  How often do you need to have someone help you when you read instructions, pamphlets, or other written materials from your doctor or pharmacy?: 1 - Never What is the last grade level you completed in school?: College degree  Diabetic? yes  Interpreter Needed?: No      Activities of Daily Living In your present state of health, do you have any difficulty performing the following activities: 07/03/2021  Hearing? N  Vision? N  Difficulty concentrating or making decisions? N  Walking or climbing stairs? N  Dressing or bathing? N  Doing errands, shopping? N  Preparing Food and eating ? N  Using the Toilet? N  In the past six months, have you accidently leaked urine? N  Do you have problems with loss of bowel control? N  Managing your Medications? N  Managing your Finances? N  Housekeeping or managing your Housekeeping? N  Some recent data might be hidden    Patient Care Team: Lesleigh Noe, MD as PCP - General (Family  Medicine)  Indicate any recent Medical Services you may have received from other than [facility] providers in the past year (date may be approximate).     Assessment:   This is a routine wellness examination for [facility].  Hearing/Vision screen Hearing Screening   _0  _1  _2  _3  _4   Right ear _5 Left ear _6 0  Vision Screening - Comments:: Last eye exam at [facility] in May 2022  Dietary issues and exercise activities discussed: Current Exercise Habits: The patient does not participate in regular exercise at present, Exercise limited by: None identified   Goals  Addressed             This Visit's Progress    Exercise 3x per week (30 min per time)        Depression Screen PHQ 2/9 Scores 07/03/2021 02/02/2021 12/20/2020 11/26/2019  PHQ - 2 Score 0 0 0 0    Fall Risk Fall Risk  07/03/2021 02/02/2021 12/20/2020  Falls in the past year? 0 0 0  Number falls in past yr: 0 0 -  Injury with Fall? - 0 -  Follow up - Falls evaluation completed -    FALL RISK PREVENTION PERTAINING TO THE HOME:  Any stairs in or around the home? Yes  If so, are there any without handrails? Yes  Home free of loose throw rugs in walkways, pet beds, electrical cords, etc? Yes  Adequate lighting in your home to reduce risk of falls? Yes   ASSISTIVE DEVICES UTILIZED TO PREVENT FALLS:  Life alert? No  Use of a cane, walker or w/c? No  Grab bars in the bathroom? Yes  Shower chair or bench in shower? Yes  Elevated toilet seat or a handicapped toilet? No   Cognitive Function:      Mini-Cog - 07/03/21 0922     Normal clock drawing test? yes    How many words correct? 2                Immunizations Immunization History  Administered Date(s) Administered   Fluad Quad(high Dose 65+) 07/03/2021   Influenza,inj,Quad PF,6+ Mos 07/02/2019, 08/08/2020   Moderna Sars-Covid-2 Vaccination 11/14/2019, 12/18/2019, 07/27/2020   PFIZER(Purple Top)SARS-COV-2 Vaccination  01/11/2021   Pneumococcal Conjugate-13 02/17/2020   Pneumococcal Polysaccharide-23 07/03/2021   Tdap 10/01/2009    TDAP status: Due, Education has been provided regarding the importance of this vaccine. Advised may receive this vaccine at local pharmacy or Health Dept. Aware to provide a copy of the vaccination record if obtained from local pharmacy or Health Dept. Verbalized acceptance and understanding.  Flu Vaccine status: Completed at today's visit  Pneumococcal vaccine status: Completed during today's visit.  Covid-19 vaccine status: Completed vaccines  Qualifies for Shingles Vaccine? Yes   Zostavax completed Yes   Shingrix Completed?: No.    Education has been provided regarding the importance of this vaccine. Patient has been advised to call insurance company to determine out of pocket expense if they have not yet received this vaccine. Advised may also receive vaccine at local pharmacy or Health Dept. Verbalized acceptance and understanding.  Screening Tests Health Maintenance  Topic Date Due   Zoster Vaccines- Shingrix (1 of 2) Never done   TETANUS/TDAP  10/02/2019   HEMOGLOBIN A1C  01/01/2022   OPHTHALMOLOGY EXAM  01/17/2022   FOOT EXAM  03/02/2022   COLONOSCOPY (Pts 45-79yr Insurance coverage will need to be confirmed)  11/26/2026   INFLUENZA VACCINE  Completed   COVID-19 Vaccine  Completed   Hepatitis C Screening  Completed   HPV VACCINES  Aged Out    Health Maintenance  Health Maintenance Due  Topic Date Due   Zoster Vaccines- Shingrix (1 of 2) Never done   TETANUS/TDAP  10/02/2019    Colorectal cancer screening: Type of screening: Colonoscopy. Completed 2018. Repeat every 10 years  Social History   Tobacco Use  Smoking Status Former   Types: Cigars  Smokeless Tobacco Never  Tobacco Comments    once a month     Lung Cancer Screening: (Low Dose CT Chest recommended if Age 67-80years, 30 pack-year currently  smoking OR have quit w/in 15years.) does  not qualify.   Lung Cancer Screening Referral: n/a  Additional Screening:  Hepatitis C Screening: does qualify; Completed 2021  Vision Screening: Recommended annual ophthalmology exams for early detection of glaucoma and other disorders of the eye. Is the patient up to date with their annual eye exam?  Yes  Who is the provider or what is the name of the office in which the patient attends annual eye exams? [facility] in [place] If pt is not established with a provider, would they like to be referred to a provider to establish care?  N/a .   Dental Screening: Recommended annual dental exams for proper oral hygiene  Community Resource Referral / Chronic Care Management: CRR required this visit?  No   CCM required this visit?  No      Plan:     Problem List Items Addressed This Visit       Cardiovascular and Mediastinum   HTN (hypertension)     Digestive   GERD (gastroesophageal reflux disease)   Relevant Medications   pantoprazole (PROTONIX) 40 MG tablet     Endocrine   Type 2 diabetes mellitus ([place])    Lab Results  Component Value Date   HGBA1C 6.8 (A) 07/03/2021  Well controlled. Cont medication      Relevant Medications   metFORMIN (GLUCOPHAGE-XR) 500 MG 24 hr tablet   Other Relevant Orders   POCT glycosylated hemoglobin (Hb A1C) (Completed)   Hyperlipidemia associated with type 2 diabetes mellitus (HCC)   Relevant Medications   metFORMIN (GLUCOPHAGE-XR) 500 MG 24 hr tablet   Other Visit Diagnoses     Encounter for Medicare annual wellness exam    -  Primary   Need for influenza vaccination       Relevant Orders   Flu Vaccine QUAD High Dose(Fluad) (Completed)   Need for 23-polyvalent pneumococcal polysaccharide vaccine       Relevant Orders   Pneumococcal polysaccharide vaccine 23-valent greater than or equal to 2yo subcutaneous/IM (Completed)        I have personally reviewed and noted the following in the patient's chart:   Medical and  social history Use of alcohol, tobacco or illicit drugs  Current medications and supplements including opioid prescriptions. Patient is not currently taking opioid prescriptions. Functional ability and status Nutritional status Physical activity Advanced directives List of other physicians Hospitalizations, surgeries, and ER visits in previous 12 months Vitals Screenings to include cognitive, depression, and falls Referrals and appointments  In addition, I have reviewed and discussed with patient certain preventive protocols, quality metrics, and best practice recommendations. A written personalized care plan for preventive services as well as general preventive health recommendations were provided to patient.     Lesleigh Noe, MD   07/03/2021

## 2021-08-15 ENCOUNTER — Ambulatory Visit: Payer: Medicare Other | Admitting: Dietician

## 2021-08-17 ENCOUNTER — Encounter: Payer: Medicare Other | Attending: Family Medicine | Admitting: Dietician

## 2021-08-17 ENCOUNTER — Encounter: Payer: Self-pay | Admitting: Dietician

## 2021-08-17 ENCOUNTER — Other Ambulatory Visit: Payer: Self-pay

## 2021-08-17 DIAGNOSIS — E119 Type 2 diabetes mellitus without complications: Secondary | ICD-10-CM | POA: Diagnosis not present

## 2021-08-17 NOTE — Progress Notes (Signed)
Diabetes Self-Management Education  Visit Type: Follow-up  Appt. Start Time: 1525 Appt. End Time: 1555  08/17/2021  [name], identified by name and date of birth, is a 65 y.o. male with a diagnosis of Diabetes:  .   ASSESSMENT Pt A1c is stable at 6.8 as of 07/03/2021.  Pt is checking their FBG 3-5 times a week, numbers range from 135-155. Pt reports difficulty sticking to their diet in the past months. Pt has gained around 10 pounds of 30 that they have originally lost. Pt has been eating too many carbs, been more sedentary, and encountered more adversity in their personal life. Pt reports not feeling full after a meal, states they might snack right after eating a meal. Pt eats in front of the TV every night, eats very quickly. Pt has seen more symptoms of fluctuating blood sugar in the past few weeks, sweating when BG is 80 or below. Pt is upset that these symptoms are returning. Pt wants to get back into their walking routine, states the hardest part is getting up and getting started.  There were no vitals taken for this visit. There is no height or weight on file to calculate BMI.   Diabetes Self-Management Education - 08/17/21 1554       Visit Information   Visit Type Follow-up      Complications   Last HgB A1C per patient/outside source 6.8 %   07/03/2021   How often do you check your blood sugar? 3-4 times / week    Fasting Blood glucose range (mg/dL) 295-621    Number of hypoglycemic episodes per month 2    Can you tell when your blood sugar is low? Yes    What do you do if your blood sugar is low? Eats carbs      Dietary Intake   Breakfast Blueberries, cottage cheese, sausage    Lunch Salad, cheese, red wine vinegarette    Dinner Cabbage, ham cooked in bacon grease    Snack (evening) peanuts, cheez its    Beverage(s) water, diet sodas.      Exercise   Exercise Type ADL's      Individualized Goals (developed by patient)   Nutrition Follow meal plan  discussed   Taper carbs late in the day   Physical Activity Exercise 3-5 times per week    Monitoring  test my blood glucose as discussed      Patient Self-Evaluation of Goals - Patient rates self as meeting previously set goals (% of time)   Nutrition 25 - 50%    Physical Activity < 25%    Medications >75%    Monitoring 25 - 50%    Problem Solving 25 - 50%    Reducing Risk 25 - 50%    Health Coping 25 - 50%      Post-Education Assessment   Patient understands the diabetes disease and treatment process. Needs Review    Patient understands incorporating nutritional management into lifestyle. Needs Review    Patient undertands incorporating physical activity into lifestyle. Needs Review    Patient understands using medications safely. Needs Review    Patient understands monitoring blood glucose, interpreting and using results Needs Review    Patient understands prevention, detection, and treatment of acute complications. Needs Review    Patient understands prevention, detection, and treatment of chronic complications. Needs Review    Patient understands how to develop strategies to address psychosocial issues. Needs Review    Patient understands how to develop  strategies to promote health/change behavior. Needs Review      Outcomes   Expected Outcomes Demonstrated interest in learning. Expect positive outcomes    Future DMSE 3-4 months    Program Status Not Completed      Subsequent Visit   Since your last visit have you continued or begun to take your medications as prescribed? Yes    Since your last visit have you had your blood pressure checked? Yes    Is your most recent blood pressure lower, unchanged, or higher since your last visit? Lower    Since your last visit have you experienced any weight changes? Gain    Weight Gain (lbs) 10    Since your last visit, are you checking your blood glucose at least once a day? No   3-4 times a week            Individualized Plan for  Diabetes Self-Management Training:   Learning Objective:  Patient will have a greater understanding of diabetes self-management. Patient education plan is to attend individual and/or group sessions per assessed needs and concerns.   Plan:   Patient Instructions  Work on tapering your carb intake down later in the day. Try to have a larger amount of  your allotted carbs earlier in the day  Choose Skinny Pop popcorn for a pre-popped, low carb snack.  Begin to work on eating more mindfully. Pick at least 2 days per week to refrain from eating in front of the TV.  When eating, pay attention to how your food looks, smells, tastes, etc. Give your food your full attention. Take one bite at a time, put your utensil down, chew your food until it is applesauce consistency and swallow before you pick your utensil back up and take your next bite.  Make a deal with yourself to bundle up and go for a walk when it gets cold. Keep your hats in plain sight as a reminder to walk! Chances are you will enjoy yourself!  Expected Outcomes:  Demonstrated interest in learning. Expect positive outcomes  If problems or questions, patient to contact team via:  Phone and Email  Future DSME appointment: 3-4 months

## 2021-08-17 NOTE — Patient Instructions (Addendum)
Work on tapering your carb intake down later in the day. Try to have a larger amount of  your allotted carbs earlier in the day  Choose Skinny Pop popcorn for a pre-popped, low carb snack.  Begin to work on eating more mindfully. Pick at least 2 days per week to refrain from eating in front of the TV.  When eating, pay attention to how your food looks, smells, tastes, etc. Give your food your full attention. Take one bite at a time, put your utensil down, chew your food until it is applesauce consistency and swallow before you pick your utensil back up and take your next bite.  Make a deal with yourself to bundle up and go for a walk when it gets cold. Keep your hats in plain sight as a reminder to walk! Chances are you will enjoy yourself!

## 2021-10-04 ENCOUNTER — Other Ambulatory Visit: Payer: Self-pay | Admitting: Family Medicine

## 2021-11-05 ENCOUNTER — Other Ambulatory Visit: Payer: Self-pay | Admitting: Family Medicine

## 2021-11-05 DIAGNOSIS — E7849 Other hyperlipidemia: Secondary | ICD-10-CM

## 2021-11-28 ENCOUNTER — Ambulatory Visit: Payer: Medicare Other | Admitting: Dietician

## 2021-12-03 ENCOUNTER — Other Ambulatory Visit: Payer: Self-pay | Admitting: Family Medicine

## 2021-12-03 DIAGNOSIS — I10 Essential (primary) hypertension: Secondary | ICD-10-CM

## 2021-12-10 ENCOUNTER — Other Ambulatory Visit: Payer: Self-pay | Admitting: Family Medicine

## 2021-12-10 DIAGNOSIS — E119 Type 2 diabetes mellitus without complications: Secondary | ICD-10-CM

## 2021-12-13 ENCOUNTER — Encounter: Payer: Self-pay | Admitting: Dietician

## 2021-12-13 ENCOUNTER — Encounter: Payer: Medicare Other | Attending: Family Medicine | Admitting: Dietician

## 2021-12-13 ENCOUNTER — Other Ambulatory Visit: Payer: Self-pay

## 2021-12-13 DIAGNOSIS — E119 Type 2 diabetes mellitus without complications: Secondary | ICD-10-CM | POA: Insufficient documentation

## 2021-12-13 NOTE — Patient Instructions (Addendum)
Add in a Fairlife Protein shake between breakfast and lunch to help prevent mid-morning hypoglycemia. Add a piece of fruit or a few crackers with it to help balance your carbs and protein. ? ?Add in some sugar free Jello to alternate with your sugar-free popsicles. ? ?Look for "ZERO" on the packaging of your soda chooses. If there is any doubt, read your nutrition label and look for 0 grams of carbohydrates. ? ?Enjoy your trip to [place]. Have a great time!! ? ? ? ?

## 2021-12-13 NOTE — Progress Notes (Signed)
Diabetes Self-Management Education ? ?Visit Type: Follow-up ? ?Appt. Start Time: 1110 Appt. End Time: 1150 ? ?12/13/2021 ? ?James Washington, identified by name and date of birth, is a 66 y.o. male with a diagnosis of Diabetes:  .  ? ?ASSESSMENT ?Pt will be traveling to [place] to visit their daughter for 3 weeks at the end of March. Pt states that they feel that they have taken a step backwards. Pt is not walking as much as before, not eating as healthy as before. Pt reports that they are aware of what they are doing wrong, but has been unable to stay on track. Pt reports having a lot of stress associated with caring for their family member.  ?Pt is checking FBG intermittently, values are ~150-180. ?Pt reports that eating popcorn instead of chips and beef jerky in the evening have helped to get their FBG in the lower end of their range. Pt reports that if they wait too long between breakfast and lunch, they will begin sweating, and getting dizzy. Pt checks their BG when this happens and states it has been as low as 79 mg/dL. Pt will eat mints or PB crackers when this happens. ?There were no vitals taken for this visit. ?There is no height or weight on file to calculate BMI. ? ? Diabetes Self-Management Education - 12/13/21 1139   ? ?  ? Visit Information  ? Visit Type Follow-up   ?  ? Complications  ? How often do you check your blood sugar? 3-4 times / week   ? Fasting Blood glucose range (mg/dL) [phone]   ? Number of hypoglycemic episodes per month 4   ? Can you tell when your blood sugar is low? Yes   ? What do you do if your blood sugar is low? Eat mints and/or crackers   ?  ? Dietary Intake  ? Breakfast Cottage cheese, blueberries,   ? Lunch Ham sandwich w/ american cheese, mayonnaise, salad w/red wine vinegarette   ? Snack (afternoon) Pretzels, cheese stick   ? Dinner 1 hot dog w/mustard, onions, tater tots   ? Snack (evening) Popcorn, a couple small bell peppers,   ? Beverage(s) water, coffee, diet sodas   ?   ? Exercise  ? Exercise Type ADL's   ?  ? Individualized Goals (developed by patient)  ? Nutrition Follow meal plan discussed   ? Physical Activity Exercise 1-2 times per week   ? Medications take my medication as prescribed   ? Monitoring  test my blood glucose as discussed   ?  ? Patient Self-Evaluation of Goals - Patient rates self as meeting previously set goals (% of time)  ? Nutrition 25 - 50%   ? Physical Activity 25 - 50%   ? Medications >75%   ? Monitoring 25 - 50%   ? Problem Solving 25 - 50%   ? Reducing Risk 25 - 50%   ? Health Coping < 25%   High stress  ?  ? Post-Education Assessment  ? Patient understands the diabetes disease and treatment process. Needs Review   ? Patient understands incorporating nutritional management into lifestyle. Needs Review   ? Patient undertands incorporating physical activity into lifestyle. Needs Review   ? Patient understands using medications safely. Demonstrates understanding / competency   ? Patient understands monitoring blood glucose, interpreting and using results Needs Review   ? Patient understands prevention, detection, and treatment of acute complications. Needs Review   ? Patient understands prevention, detection,  and treatment of chronic complications. Needs Review   ? Patient understands how to develop strategies to address psychosocial issues. Needs Review   ? Patient understands how to develop strategies to promote health/change behavior. Needs Review   ?  ? Outcomes  ? Expected Outcomes Demonstrated limited interest in learning.  Expect minimal changes   High stress is standing in the way of progress.  ? Future DMSE 3-4 months   ? Program Status Not Completed   ?  ? Subsequent Visit  ? Since your last visit have you continued or begun to take your medications as prescribed? Yes   ? Since your last visit have you experienced any weight changes? Gain   ? Weight Gain (lbs) 10   ? Since your last visit, are you checking your blood glucose at least once a day?  No   ? ?  ?  ? ?  ? ? ?Individualized Plan for Diabetes Self-Management Training:  ? ?Learning Objective:  Patient will have a greater understanding of diabetes self-management. ?Patient education plan is to attend individual and/or group sessions per assessed needs and concerns. ?  ?Plan:  ? ?Patient Instructions  ?Add in a Fairlife Protein shake between breakfast and lunch to help prevent mid-morning hypoglycemia. Add a piece of fruit or a few crackers with it to help balance your carbs and protein. ? ?Add in some sugar free Jello to alternate with your sugar-free popsicles. ? ?Look for "ZERO" on the packaging of your soda chooses. If there is any doubt, read your nutrition label and look for 0 grams of carbohydrates. ? ?Enjoy your trip to [place]. Have a great time!! ? ? ? ? ?Expected Outcomes:  Demonstrated limited interest in learning.  Expect minimal changes (High stress is standing in the way of progress.) ? ?If problems or questions, patient to contact team via:  Phone and Email ? ?Future DSME appointment: 3-4 months ?

## 2022-01-01 ENCOUNTER — Ambulatory Visit: Payer: Medicare Other | Admitting: Family Medicine

## 2022-01-17 ENCOUNTER — Ambulatory Visit (INDEPENDENT_AMBULATORY_CARE_PROVIDER_SITE_OTHER): Payer: Medicare Other | Admitting: Family Medicine

## 2022-01-17 VITALS — BP 122/80 | HR 84 | Temp 97.9°F | Ht 72.0 in | Wt 279.4 lb

## 2022-01-17 DIAGNOSIS — E119 Type 2 diabetes mellitus without complications: Secondary | ICD-10-CM

## 2022-01-17 DIAGNOSIS — I10 Essential (primary) hypertension: Secondary | ICD-10-CM

## 2022-01-17 DIAGNOSIS — E1169 Type 2 diabetes mellitus with other specified complication: Secondary | ICD-10-CM

## 2022-01-17 DIAGNOSIS — K219 Gastro-esophageal reflux disease without esophagitis: Secondary | ICD-10-CM

## 2022-01-17 DIAGNOSIS — J069 Acute upper respiratory infection, unspecified: Secondary | ICD-10-CM

## 2022-01-17 LAB — POCT GLYCOSYLATED HEMOGLOBIN (HGB A1C): Hemoglobin A1C: 7.4 % — AB (ref 4.0–5.6)

## 2022-01-17 NOTE — Assessment & Plan Note (Signed)
Stable. Symptomatic when he stops medication. Cont protonix 40 mg daily ?

## 2022-01-17 NOTE — Assessment & Plan Note (Signed)
Lab Results  ?Component Value Date  ? HGBA1C 7.4 (A) 01/17/2022  ? ?Slightly worse in setting of poor diet. Work on diet. Cont metformin 1000 mg bid, amaryl 4 mg. Return 6 months or sooner if CBG worsening.  ?

## 2022-01-17 NOTE — Assessment & Plan Note (Signed)
Well controlled. Cont lisinopril 20 mg.  

## 2022-01-17 NOTE — Progress Notes (Signed)
? ?Subjective:  ? ?  ?James Washington is a 66 y.o. male presenting for Follow-up (6 mo ) ?  ? ? ?HPI ? ?#Diabetes ?Currently taking glimepiride and metformin  ?Using medications without difficulties: Yes ?Hypoglycemic episodes:Yes  ?Hyperglycemic episodes:Yes  ?Feet problems:No  ?Blood Sugars averaging: 150-180 AM, but post meals lower ?Last HgbA1c:  ?Lab Results  ?Component Value Date  ? HGBA1C 7.4 (A) 01/17/2022  ? ? ?Diabetes Health Maintenance Due:  ?  ?Diabetes Health Maintenance Due  ?Topic Date Due  ? OPHTHALMOLOGY EXAM  01/17/2022  ? FOOT EXAM  03/02/2022  ? HEMOGLOBIN A1C  07/19/2022  ? ?Working with nutritionist ?Working to eat protein snacks at night ? ?#GERD ?- symptoms if he doesn't take protonix ? ?Congestion ?Cough ?Started 4/12  ?Recent travel to [place] ?Got a little better  ?Mostly at night now ?Wife got sick too ?Negative covid test on Tuesday ?No fever/chills ?Nyquil w/ help ? ? ?Review of Systems ? ? ?Social History  ? ?Tobacco Use  ?Smoking Status Former  ? Types: Cigars  ?Smokeless Tobacco Never  ?Tobacco Comments  ?  once a month  ? ? ? ?   ?Objective:  ?  ?BP Readings from Last 3 Encounters:  ?01/17/22 122/80  ?07/03/21 120/72  ?03/02/21 122/72  ? ?Wt Readings from Last 3 Encounters:  ?01/17/22 279 lb 6 oz (126.7 kg)  ?07/03/21 274 lb 8 oz (124.5 kg)  ?05/17/21 270 lb (122.5 kg)  ? ? ?BP 122/80   Pulse 84   Temp 97.9 ?F (36.6 ?C) (Oral)   Ht 6' (1.829 m)   Wt 279 lb 6 oz (126.7 kg)   SpO2 96%   BMI 37.89 kg/m?  ? ? ?Physical Exam ?Constitutional:   ?   Appearance: Normal appearance. He is not ill-appearing or diaphoretic.  ?HENT:  ?   Head: Normocephalic and atraumatic.  ?   Right Ear: Tympanic membrane and external ear normal.  ?   Left Ear: Tympanic membrane and external ear normal.  ?   Nose: Nose normal.  ?   Mouth/Throat:  ?   Mouth: Mucous membranes are moist.  ?   Pharynx: No posterior oropharyngeal erythema.  ?Eyes:  ?   General: No scleral icterus. ?   Extraocular  Movements: Extraocular movements intact.  ?   Conjunctiva/sclera: Conjunctivae normal.  ?Cardiovascular:  ?   Rate and Rhythm: Normal rate and regular rhythm.  ?Pulmonary:  ?   Effort: Pulmonary effort is normal. No respiratory distress.  ?   Breath sounds: Normal breath sounds. No wheezing.  ?Musculoskeletal:  ?   Cervical back: Neck supple.  ?Skin: ?   General: Skin is warm and dry.  ?Neurological:  ?   Mental Status: He is alert. Mental status is at baseline.  ?Psychiatric:     ?   Mood and Affect: Mood normal.  ? ? ? ? ? ?   ?Assessment & Plan:  ? ?Problem List Items Addressed This Visit   ? ?  ? Cardiovascular and Mediastinum  ? HTN (hypertension)  ?  Well controlled. Cont lisinopril 20 mg.  ? ?  ?  ?  ? Digestive  ? GERD (gastroesophageal reflux disease)  ?  Stable. Symptomatic when he stops medication. Cont protonix 40 mg daily ? ?  ?  ?  ? Endocrine  ? Type 2 diabetes mellitus with other specified complication (HCC) - Primary  ?  Lab Results  ?Component Value Date  ? HGBA1C 7.4 (A)  01/17/2022  ?Slightly worse in setting of poor diet. Work on diet. Cont metformin 1000 mg bid, amaryl 4 mg. Return 6 months or sooner if CBG worsening.  ?  ?  ? ?Other Visit Diagnoses   ? ? Viral URI with cough      ? ?  ? ?Discussed likely resolving viral syndrome. Cont prn nyquil. Update if worsening/no improvement.  ? ?Return in about 6 months (around 07/19/2022) for medicare wellness. ? ?Lynnda Child, MD ? ? ? ?

## 2022-01-17 NOTE — Patient Instructions (Addendum)
Diabetes ?- work on diet ?- continue medication ?- if glucose levels continue to be high, make an appointment in 3 months ? ? ?Based on your symptoms, it looks like you have a virus.  ? ?Antibiotics are not need for a viral infection but the following will help:  ? ?Drink plenty of fluids ?Get lots of rest ? ?Sinus Congestion ?1) Neti Pot (Saline rinse) -- 2 times day -- if tolerated ?2) Flonase (Store Brand ok) - once daily ?3) Over the counter congestion medications ? ?Cough ?1) Cough drops can be helpful ?2) Nyquil (or nighttime cough medication) ?3) Honey is proven to be one of the best cough medications  ?4) Cough medicine with Dextromethorphan can also be helpful ? ?Sore Throat ?1) Honey as above, cough drops ?2) Ibuprofen or Aleve can be helpful ?3) Salt water Gargles ? ?If you develop fevers (Temperature >100.4), chills, worsening symptoms or symptoms lasting longer than 10 days return to clinic.   ? ?

## 2022-02-20 ENCOUNTER — Ambulatory Visit: Payer: Medicare Other | Admitting: Dietician

## 2022-03-07 ENCOUNTER — Other Ambulatory Visit: Payer: Self-pay | Admitting: Family Medicine

## 2022-03-07 DIAGNOSIS — I10 Essential (primary) hypertension: Secondary | ICD-10-CM

## 2022-03-13 ENCOUNTER — Ambulatory Visit: Payer: Medicare Other | Admitting: Dietician

## 2022-06-03 ENCOUNTER — Other Ambulatory Visit: Payer: Self-pay | Admitting: Family Medicine

## 2022-06-03 DIAGNOSIS — E119 Type 2 diabetes mellitus without complications: Secondary | ICD-10-CM

## 2022-07-12 ENCOUNTER — Ambulatory Visit (INDEPENDENT_AMBULATORY_CARE_PROVIDER_SITE_OTHER): Payer: Medicare Other

## 2022-07-12 VITALS — Ht 72.0 in | Wt 279.0 lb

## 2022-07-12 DIAGNOSIS — Z0000 Encounter for general adult medical examination without abnormal findings: Secondary | ICD-10-CM | POA: Diagnosis not present

## 2022-07-12 NOTE — Progress Notes (Addendum)
Subjective:   James Washington is a 66 y.o. male who presents for Medicare Annual/Subsequent preventive examination.  Review of Systems   Virtual Visit via Telephone Note  I connected with  James Washington on 07/12/22 at 12:30 PM EDT by telephone and verified that I am speaking with the correct person using two identifiers.  Location: Patient: Home Provider: Office Persons participating in the virtual visit: patient/Nurse Health Advisor   I discussed the limitations, risks, security and privacy concerns of performing an evaluation and management service by telephone and the availability of in person appointments. The patient expressed understanding and agreed to proceed.  Interactive audio and video telecommunications were attempted between this nurse and patient, however failed, due to patient having technical difficulties OR patient did not have access to video capability.  We continued and completed visit with audio only.  Some vital signs may be absent or patient reported.   James Peaches, LPN  Cardiac Risk Factors include: advanced age (>38mn, >>76women);hypertension;diabetes mellitus;male gender;obesity (BMI >30kg/m2)     Objective:    Today's Vitals   07/12/22 1239  Weight: 279 lb (126.6 kg)  Height: 6' (1.829 m)   Body mass index is 37.84 kg/m.     07/12/2022   12:48 PM 07/03/2021    9:18 AM 12/20/2020    9:24 AM  Advanced Directives  Does Patient Have a Medical Advance Directive? Yes No No  Type of AParamedicof AMarcelineLiving will    Copy of H[place]ena Vist[place] Chart? No - copy requested    Would patient like information on creating a medical advance directive?  Yes (MAU/Ambulatory/Procedural Areas - Information given) No - Patient declined    Current Medications (verified) Outpatient Encounter Medications as of 07/12/2022  Medication Sig   atorvastatin (LIPITOR) 20 MG tablet TAKE 1 TABLET (20 MG TOTAL) BY  MOUTH EVERY EVENING. FOR CHOLESTEROL.   blood glucose meter kit and supplies Check sugar once daily. DX E11.9 Contour Next meter and supplies   CONTOUR NEXT TEST test strip CHECK SUGAR ONCE DAILY. DX E11.9   glimepiride (AMARYL) 4 MG tablet TAKE 1 TABLET BY MOUTH EVERY DAY   lisinopril (ZESTRIL) 20 MG tablet TAKE 1 TABLET BY MOUTH EVERY DAY   metFORMIN (GLUCOPHAGE-XR) 500 MG 24 hr tablet Take 2 tablets (1,000 mg total) by mouth 2 (two) times daily with a meal.   pantoprazole (PROTONIX) 40 MG tablet Take 1 tablet (40 mg total) by mouth daily.   No facility-administered encounter medications on file as of 07/12/2022.    Allergies (verified) Penicillins, Shrimp [shellfish allergy], and Sulfa antibiotics   History: Past Medical History:  Diagnosis Date   COVID-19 02/02/2021   Diabetes ([place]    GERD (gastroesophageal reflux disease)    Hypertension    Past Surgical History:  Procedure Laterality Date   NO PAST SURGERIES     Family History  Problem Relation Age of Onset   Lung cancer Mother        small cell   Heart attack Mother 644  Diabetes Father    Heart attack Father 677  Heart attack Maternal Grandfather [phone]  Diabetes Sister    Heart disease Sister    Heart attack Paternal Grandmother [phone]  Asthma Paternal Grandmother    Prostate cancer Paternal Grandfather [phone]  Hypothyroidism Sister    Social History   Socioeconomic History   Marital status: Married    Spouse name:  Jana Half   Number of children: 1   Years of education: BS from [employer] education level: Not on file  Occupational History   Occupation: Retired  Tobacco Use   Smoking status: Former    Types: Cigars   Smokeless tobacco: Never   Tobacco comments:     once a month  Vaping Use   Vaping Use: Never used  Substance and Sexual Activity   Alcohol use: Yes    Comment: maybe once a month-beer   Drug use: Never   Sexual activity: Yes    Birth control/protection: Post-menopausal  Other Topics  Concern   Not on file  Social History Narrative   11/26/19   From: [place]: with wife Jana Half   Work: retired from [employer]       Family: Daughter - Jinny Blossom (married, 1 son, lives in [place])      Enjoys: golf, fishing, digging for bottles      Exercise: walking - [place] 6-7 days a week   Diet: trying to do better       Safety   Seat belts: Yes    Guns: Yes  and secure   Safe in relationships: Yes    Social Determinants of Health   Financial Resource Strain: Low Risk  (07/12/2022)   Overall Financial Resource Strain (CARDIA)    Difficulty of Paying Living Expenses: Not hard at all  Food Insecurity: No Food Insecurity (07/12/2022)   Hunger Vital Sign    Worried About Running Out of Food in the Last Year: Never true    Home Gardens in the Last Year: Never true  Transportation Needs: No Transportation Needs (07/12/2022)   PRAPARE - Hydrologist (Medical): No    Lack of Transportation (Non-Medical): No  Physical Activity: Insufficiently Active (07/12/2022)   Exercise Vital Sign    Days of Exercise per Week: 3 days    Minutes of Exercise per Session: 30 min  Stress: No Stress Concern Present (07/12/2022)   [place]    Feeling of Stress : Not at all  Social Connections: [place] (07/12/2022)   Social Connection and Isolation Panel [NHANES]    Frequency of Communication with Friends and Family: More than three times a week    Frequency of Social Gatherings with Friends and Family: More than three times a week    Attends Religious Services: More than 4 times per year    Active Member of [employer] or Organizations: Yes    Attends Music therapist: More than 4 times per year    Marital Status: Married    Tobacco Counseling Counseling given: Yes Tobacco comments:  once a month   Clinical  Intake:  Pre-visit preparation completed: No  Pain : No/denies pain     BMI - recorded: 37.84 Nutritional Status: BMI > 30  Obese Nutritional Risks: None Diabetes: Yes (Pre Diabetic) CBG done?: No Did pt. bring in CBG monitor from home?: No  How often do you need to have someone help you when you read instructions, pamphlets, or other written materials from your doctor or pharmacy?: 1 - Never  Diabetic?  Pre Diabetic  Interpreter Needed?: No  Information entered by :: Rolene Arbour LPN   Activities of Daily Living    07/12/2022   12:47 PM  In your present state of health, do you have  any difficulty performing the following activities:  Hearing? 0  Vision? 0  Difficulty concentrating or making decisions? 0  Walking or climbing stairs? 0  Dressing or bathing? 0  Doing errands, shopping? 0  Preparing Food and eating ? N  Using the Toilet? N  In the past six months, have you accidently leaked urine? N  Do you have problems with loss of bowel control? N  Managing your Medications? N  Managing your Finances? N  Housekeeping or managing your Housekeeping? N    Patient Care Team: Waunita Schooner, MD as PCP - General (Family Medicine)  Indicate any recent Medical Services you may have received from other than [facility] providers in the past year (date may be approximate).     Assessment:   This is a routine wellness examination for [facility].  Hearing/Vision screen Hearing Screening - Comments:: Denies hearing difficulties   Vision Screening - Comments:: Wears rx glasses - up to date with routine eye exams with  Dr Gloriann Loan  Dietary issues and exercise activities discussed: Current Exercise Habits: Home exercise routine, Type of exercise: walking, Time (Minutes): 30, Frequency (Times/Week): 3, Weekly Exercise (Minutes/Week): 90, Intensity: Moderate, Exercise limited by: None identified   Goals Addressed               This Visit's Progress     Lose weight (pt-stated)         I want to lose about 10-20 lbs       Depression Screen    07/12/2022   12:45 PM 07/03/2021    9:20 AM 02/02/2021    1:39 PM 12/20/2020    9:25 AM 11/26/2019   10:15 AM  PHQ 2/9 Scores  PHQ - 2 Score 0 0 0 0 0    Fall Risk    07/12/2022   12:47 PM 07/03/2021    9:03 AM 02/02/2021    1:39 PM 12/20/2020    9:24 AM  [place] in the past year? 0 0 0 0  Number falls in past yr: 0 0 0   Injury with Fall? 0  0   Risk for fall due to : No Fall Risks     Follow up Falls prevention discussed  Falls evaluation completed     [place]:  Any stairs in or around the home? Yes  If so, are there any without handrails? No  Home free of loose throw rugs in walkways, pet beds, electrical cords, etc? Yes  Adequate lighting in your home to reduce risk of falls? Yes   ASSISTIVE DEVICES UTILIZED TO PREVENT FALLS:  Life alert? No  Use of a cane, walker or w/c? No  Grab bars in the bathroom? Yes  Shower chair or bench in shower? Yes  Elevated toilet seat or a handicapped toilet? No   TIMED UP AND GO:  Was the test performed? No . Audio Visit   Cognitive Function:        07/12/2022   12:49 PM  6CIT Screen  What Year? 0 points  What month? 0 points  What time? 0 points  Count back from 20 0 points  Months in reverse 0 points  Repeat phrase 0 points  Total Score 0 points    Immunizations Immunization History  Administered Date(s) Administered   Fluad Quad(high Dose 65+) 07/03/2021, 07/04/2022   Influenza,inj,Quad PF,6+ Mos 07/02/2019, 08/08/2020   Moderna Sars-Covid-2 Vaccination 11/14/2019, 12/18/2019, 07/27/2020   PFIZER(Purple Top)SARS-COV-2 Vaccination 01/11/2021  Pneumococcal Conjugate-13 02/17/2020   Pneumococcal Polysaccharide-23 07/03/2021   Respiratory Syncytial Virus Vaccine,Recomb Aduvanted(Arexvy) 07/04/2022   Tdap 10/01/2009    TDAP status: Due, Education has been provided regarding the importance of this vaccine.  Advised may receive this vaccine at local pharmacy or Health Dept. Aware to provide a copy of the vaccination record if obtained from local pharmacy or Health Dept. Verbalized acceptance and understanding.  Flu Vaccine status: Up to date  Pneumococcal vaccine status: Up to date  Covid-19 vaccine status: Completed vaccines  Qualifies for Shingles Vaccine? Yes   Zostavax completed No   Shingrix Completed?: No.    Education has been provided regarding the importance of this vaccine. Patient has been advised to call insurance company to determine out of pocket expense if they have not yet received this vaccine. Advised may also receive vaccine at local pharmacy or Health Dept. Verbalized acceptance and understanding.  Screening Tests Health Maintenance  Topic Date Due   Diabetic kidney evaluation - Urine ACR  03/03/2020   OPHTHALMOLOGY EXAM  01/17/2022   Diabetic kidney evaluation - GFR measurement  03/02/2022   FOOT EXAM  03/02/2022   COVID-19 Vaccine (5 - Moderna series) 07/28/2022 (Originally 03/08/2021)   Zoster Vaccines- Shingrix (1 of 2) 10/12/2022 (Originally 09/07/2004)   TETANUS/TDAP  07/13/2023 (Originally 10/02/2019)   HEMOGLOBIN A1C  07/19/2022   COLONOSCOPY (Pts 45-26yr Insurance coverage will need to be confirmed)  11/26/2026   Pneumonia Vaccine 68 Years old  Completed   INFLUENZA VACCINE  Completed   Hepatitis C Screening  Completed   HPV VACCINES  Aged Out    Health Maintenance  Health Maintenance Due  Topic Date Due   Diabetic kidney evaluation - Urine ACR  03/03/2020   OPHTHALMOLOGY EXAM  01/17/2022   Diabetic kidney evaluation - GFR measurement  03/02/2022   FOOT EXAM  03/02/2022    Colorectal cancer screening: Type of screening: Colonoscopy. Completed 11/26/16. Repeat every 10 years  Lung Cancer Screening: (Low Dose CT Chest recommended if Age 68-80years, 30 pack-year currently smoking OR have quit w/in 15years.) does qualify.   Lung Cancer Screening Referral:  Patient deferred  Additional Screening:  Hepatitis C Screening: does qualify; Completed 02/17/20  Vision Screening: Recommended annual ophthalmology exams for early detection of glaucoma and other disorders of the eye. Is the patient up to date with their annual eye exam?  Yes  Who is the provider or what is the name of the office in which the patient attends annual eye exams? Dr BGloriann LoanIf pt is not established with a provider, would they like to be referred to a provider to establish care? No .   Dental Screening: Recommended annual dental exams for proper oral hygiene  Community Resource Referral / Chronic Care Management:  CRR required this visit?  No   CCM required this visit?  No      Plan:     I have personally reviewed and noted the following in the patient's chart:   Medical and social history Use of alcohol, tobacco or illicit drugs  Current medications and supplements including opioid prescriptions. Patient is not currently taking opioid prescriptions. Functional ability and status Nutritional status Physical activity Advanced directives List of other physicians Hospitalizations, surgeries, and ER visits in previous 12 months Vitals Screenings to include cognitive, depression, and falls Referrals and appointments  In addition, I have reviewed and discussed with patient certain preventive protocols, quality metrics, and best practice recommendations. A written personalized care plan for preventive  services as well as general preventive health recommendations were provided to patient.     James Peaches, LPN   [id number]   Nurse Notes: Patient due Diabetic Kidney Evaluation-Urine ACR and Diabetic Kidney Evaluation-GFR measurement

## 2022-07-12 NOTE — Patient Instructions (Addendum)
James Washington , Thank you for taking time to come for your Medicare Wellness Visit. I appreciate your ongoing commitment to your health goals. Please review the following plan we discussed and let me know if I can assist you in the future.   These are the goals we discussed:  Goals       Exercise 3x per week (30 min per time)      Lose weight (pt-stated)      I want to lose about 10-20 lbs        This is a list of the screening recommended for you and due dates:  Health Maintenance  Topic Date Due   Yearly kidney health urinalysis for diabetes  03/03/2020   Eye exam for diabetics  01/17/2022   Yearly kidney function blood test for diabetes  03/02/2022   Complete foot exam   03/02/2022   COVID-19 Vaccine (5 - Moderna series) 07/28/2022*   Zoster (Shingles) Vaccine (1 of 2) 10/12/2022*   Tetanus Vaccine  07/13/2023*   Hemoglobin A1C  07/19/2022   Colon Cancer Screening  11/26/2026   Pneumonia Vaccine  Completed   Flu Shot  Completed   Hepatitis C Screening: USPSTF Recommendation to screen - Ages 18-79 yo.  Completed   HPV Vaccine  Aged Out  *Topic was postponed. The date shown is not the original due date.    Advanced directives: Please bring a copy of your health care power of attorney and living will to the office to be added to your chart at your convenience.   Conditions/risks identified: None  Next appointment: Follow up in one year for your annual wellness visit.    Preventive Care 59 Years and Older, Male  Preventive care refers to lifestyle choices and visits with your health care provider that can promote health and wellness. What does preventive care include? A yearly physical exam. This is also called an annual well check. Dental exams once or twice a year. Routine eye exams. Ask your health care provider how often you should have your eyes checked. Personal lifestyle choices, including: Daily care of your teeth and gums. Regular physical activity. Eating a  healthy diet. Avoiding tobacco and drug use. Limiting alcohol use. Practicing safe sex. Taking low doses of aspirin every day. Taking vitamin and mineral supplements as recommended by your health care provider. What happens during an annual well check? The services and screenings done by your health care provider during your annual well check will depend on your age, overall health, lifestyle risk factors, and family history of disease. Counseling  Your health care provider may ask you questions about your: Alcohol use. Tobacco use. Drug use. Emotional well-being. Home and relationship well-being. Sexual activity. Eating habits. History of falls. Memory and ability to understand (cognition). Work and work Astronomer. Screening  You may have the following tests or measurements: Height, weight, and BMI. Blood pressure. Lipid and cholesterol levels. These may be checked every 5 years, or more frequently if you are over 61 years old. Skin check. Lung cancer screening. You may have this screening every year starting at age 21 if you have a 30-pack-year history of smoking and currently smoke or have quit within the past 15 years. Fecal occult blood test (FOBT) of the stool. You may have this test every year starting at age 58. Flexible sigmoidoscopy or colonoscopy. You may have a sigmoidoscopy every 5 years or a colonoscopy every 10 years starting at age 9. Prostate cancer screening. Recommendations will vary  depending on your family history and other risks. Hepatitis C blood test. Hepatitis B blood test. Sexually transmitted disease (STD) testing. Diabetes screening. This is done by checking your blood sugar (glucose) after you have not eaten for a while (fasting). You may have this done every 1-3 years. Abdominal aortic aneurysm (AAA) screening. You may need this if you are a current or former smoker. Osteoporosis. You may be screened starting at age 66 if you are at high risk. Talk  with your health care provider about your test results, treatment options, and if necessary, the need for more tests. Vaccines  Your health care provider may recommend certain vaccines, such as: Influenza vaccine. This is recommended every year. Tetanus, diphtheria, and acellular pertussis (Tdap, Td) vaccine. You may need a Td booster every 10 years. Zoster vaccine. You may need this after age 12. Pneumococcal 13-valent conjugate (PCV13) vaccine. One dose is recommended after age 60. Pneumococcal polysaccharide (PPSV23) vaccine. One dose is recommended after age 80. Talk to your health care provider about which screenings and vaccines you need and how often you need them. This information is not intended to replace advice given to you by your health care provider. Make sure you discuss any questions you have with your health care provider. Document Released: 10/14/2015 Document Revised: 06/06/2016 Document Reviewed: 07/19/2015 Elsevier Interactive Patient Education  2017 [employer] Prevention in the Home Falls can cause injuries. They can happen to people of all ages. There are many things you can do to make your home safe and to help prevent falls. What can I do on the outside of my home? Regularly fix the edges of walkways and driveways and fix any cracks. Remove anything that might make you trip as you walk through a door, such as a raised step or threshold. Trim any bushes or trees on the path to your home. Use bright outdoor lighting. Clear any walking paths of anything that might make someone trip, such as rocks or tools. Regularly check to see if handrails are loose or broken. Make sure that both sides of any steps have handrails. Any raised decks and porches should have guardrails on the edges. Have any leaves, snow, or ice cleared regularly. Use sand or salt on walking paths during winter. Clean up any spills in your garage right away. This includes oil or grease  spills. What can I do in the bathroom? Use night lights. Install grab bars by the toilet and in the tub and shower. Do not use towel bars as grab bars. Use non-skid mats or decals in the tub or shower. If you need to sit down in the shower, use a plastic, non-slip stool. Keep the floor dry. Clean up any water that spills on the floor as soon as it happens. Remove soap buildup in the tub or shower regularly. Attach bath mats securely with double-sided non-slip rug tape. Do not have throw rugs and other things on the floor that can make you trip. What can I do in the bedroom? Use night lights. Make sure that you have a light by your bed that is easy to reach. Do not use any sheets or blankets that are too big for your bed. They should not hang down onto the floor. Have a firm chair that has side arms. You can use this for support while you get dressed. Do not have throw rugs and other things on the floor that can make you trip. What can I do in the  kitchen? Clean up any spills right away. Avoid walking on wet floors. Keep items that you use a lot in easy-to-reach places. If you need to reach something above you, use a strong step stool that has a grab bar. Keep electrical cords out of the way. Do not use floor polish or wax that makes floors slippery. If you must use wax, use non-skid floor wax. Do not have throw rugs and other things on the floor that can make you trip. What can I do with my stairs? Do not leave any items on the stairs. Make sure that there are handrails on both sides of the stairs and use them. Fix handrails that are broken or loose. Make sure that handrails are as long as the stairways. Check any carpeting to make sure that it is firmly attached to the stairs. Fix any carpet that is loose or worn. Avoid having throw rugs at the top or bottom of the stairs. If you do have throw rugs, attach them to the floor with carpet tape. Make sure that you have a light switch at the  top of the stairs and the bottom of the stairs. If you do not have them, ask someone to add them for you. What else can I do to help prevent falls? Wear shoes that: Do not have high heels. Have rubber bottoms. Are comfortable and fit you well. Are closed at the toe. Do not wear sandals. If you use a stepladder: Make sure that it is fully opened. Do not climb a closed stepladder. Make sure that both sides of the stepladder are locked into place. Ask someone to hold it for you, if possible. Clearly mark and make sure that you can see: Any grab bars or handrails. First and last steps. Where the edge of each step is. Use tools that help you move around (mobility aids) if they are needed. These include: Canes. Walkers. Scooters. Crutches. Turn on the lights when you go into a dark area. Replace any light bulbs as soon as they burn out. Set up your furniture so you have a clear path. Avoid moving your furniture around. If any of your floors are uneven, fix them. If there are any pets around you, be aware of where they are. Review your medicines with your doctor. Some medicines can make you feel dizzy. This can increase your chance of falling. Ask your doctor what other things that you can do to help prevent falls. This information is not intended to replace advice given to you by your health care provider. Make sure you discuss any questions you have with your health care provider. Document Released: 07/14/2009 Document Revised: 02/23/2016 Document Reviewed: 10/22/2014 Elsevier Interactive Patient Education  2017 [employer].

## 2022-07-17 LAB — HM DIABETES EYE EXAM

## 2022-07-18 ENCOUNTER — Other Ambulatory Visit: Payer: Self-pay

## 2022-07-18 DIAGNOSIS — K219 Gastro-esophageal reflux disease without esophagitis: Secondary | ICD-10-CM

## 2022-07-18 MED ORDER — PANTOPRAZOLE SODIUM 40 MG PO TBEC: 40.0000 mg | DELAYED_RELEASE_TABLET | Freq: Every day | ORAL | 0 refills | Status: DC

## 2022-07-19 ENCOUNTER — Ambulatory Visit: Payer: Medicare Other | Admitting: Family Medicine

## 2022-07-23 ENCOUNTER — Encounter: Payer: Self-pay | Admitting: Family

## 2022-07-23 ENCOUNTER — Ambulatory Visit (INDEPENDENT_AMBULATORY_CARE_PROVIDER_SITE_OTHER): Payer: Medicare Other | Admitting: Family

## 2022-07-23 VITALS — BP 132/78 | HR 85 | Temp 98.6°F | Resp 16 | Ht 72.0 in | Wt 290.5 lb

## 2022-07-23 DIAGNOSIS — E785 Hyperlipidemia, unspecified: Secondary | ICD-10-CM

## 2022-07-23 DIAGNOSIS — K219 Gastro-esophageal reflux disease without esophagitis: Secondary | ICD-10-CM

## 2022-07-23 DIAGNOSIS — E1169 Type 2 diabetes mellitus with other specified complication: Secondary | ICD-10-CM

## 2022-07-23 DIAGNOSIS — E119 Type 2 diabetes mellitus without complications: Secondary | ICD-10-CM

## 2022-07-23 DIAGNOSIS — I10 Essential (primary) hypertension: Secondary | ICD-10-CM

## 2022-07-23 LAB — COMPREHENSIVE METABOLIC PANEL
ALT: 19 U/L (ref 0–53)
AST: 17 U/L (ref 0–37)
Albumin: 4.3 g/dL (ref 3.5–5.2)
Alkaline Phosphatase: 85 U/L (ref 39–117)
BUN: 14 mg/dL (ref 6–23)
CO2: 29 mEq/L (ref 19–32)
Calcium: 9.4 mg/dL (ref 8.4–10.5)
Chloride: 103 mEq/L (ref 96–112)
Creatinine, Ser: 0.82 mg/dL (ref 0.40–1.50)
GFR: 90.66 mL/min (ref >60.00)
Glucose, Bld: 109 mg/dL — ABNORMAL HIGH (ref 70–99)
Potassium: 4.9 mEq/L (ref 3.5–5.1)
Sodium: 138 mEq/L (ref 135–145)
Total Bilirubin: 0.4 mg/dL (ref 0.2–1.2)
Total Protein: 6.9 g/dL (ref 6.0–8.3)

## 2022-07-23 LAB — POCT GLYCOSYLATED HEMOGLOBIN (HGB A1C): Hemoglobin A1C: 8.3 % — AB (ref 4.0–5.6)

## 2022-07-23 LAB — CBC
HCT: 41.2 % (ref 39.0–52.0)
Hemoglobin: 13.5 g/dL (ref 13.0–17.0)
MCHC: 32.7 g/dL (ref 30.0–36.0)
MCV: 87.2 fl (ref 78.0–100.0)
Platelets: 204 10*3/uL (ref 150.0–400.0)
RBC: 4.72 Mil/uL (ref 4.22–5.81)
RDW: 13.4 % (ref 11.5–15.5)
WBC: 8.7 10*3/uL (ref 4.0–10.5)

## 2022-07-23 LAB — LIPID PANEL
Cholesterol: 100 mg/dL (ref 0–200)
HDL: 42.5 mg/dL (ref >39.00)
LDL Cholesterol: 40 mg/dL (ref 0–99)
NonHDL: 57.04
Total CHOL/HDL Ratio: 2
Triglycerides: 83 mg/dL (ref 0.0–149.0)
VLDL: 16.6 mg/dL (ref 0.0–40.0)

## 2022-07-23 MED ORDER — PANTOPRAZOLE SODIUM 40 MG PO TBEC: 40.0000 mg | DELAYED_RELEASE_TABLET | Freq: Every day | ORAL | 1 refills | Status: DC

## 2022-07-23 NOTE — Assessment & Plan Note (Signed)
Continue lisinopril 20 mg once daily. Pt advised of the following:  Continue medication as prescribed. Monitor blood pressure periodically and/or when you feel symptomatic. Goal is <130/90 on average. Ensure that you have rested for 30 minutes prior to checking your blood pressure. Record your readings and bring them to your next visit if necessary.work on a low sodium diet.  

## 2022-07-23 NOTE — Progress Notes (Signed)
Established Patient Office Visit  Subjective:  Patient ID: James Washington, male    DOB: 14-Apr-1956  Age: 66 y.o. MRN: [id number]  CC:  Chief Complaint  Patient presents with   Transitions Of Care    HPI James Washington is here for a transition of care visit.  Prior provider was: Dr Waunita Schooner  Pt is without acute concerns.   chronic concerns:  DM2: on metformin 1000 mg bid and also glimepiride 4 mg once daily. He does admit to poor diet recently, not currently exercising.  Lab Results  Component Value Date   HGBA1C 8.3 (A) 07/23/2022   HLD: taking atorvastatin 20 mg once daily.    Past Medical History:  Diagnosis Date   COVID-19 02/02/2021   Diabetes ([place])    GERD (gastroesophageal reflux disease)    Hypertension     Past Surgical History:  Procedure Laterality Date   NO PAST SURGERIES      Family History  Problem Relation Age of Onset   Lung cancer Mother        small cell   Heart attack Mother 52   Diabetes Father    Heart attack Father 71   Heart attack Maternal Grandfather 62   Diabetes Sister    Heart disease Sister    Heart attack Paternal Grandmother 78   Asthma Paternal Grandmother    Prostate cancer Paternal Grandfather 84   Hypothyroidism Sister     Social History   Socioeconomic History   Marital status: Married    Spouse name: Jana Half   Number of children: 1   Years of education: BS from [employer] education level: Not on file  Occupational History   Occupation: Retired    Fish farm manager: [employer]  Tobacco Use   Smoking status: Former    Types: Cigars   Smokeless tobacco: Never   Tobacco comments:     once a month  Vaping Use   Vaping Use: Never used  Substance and Sexual Activity   Alcohol use: Yes    Comment: maybe once a month-beer   Drug use: Never   Sexual activity: Yes    Partners: Female    Birth control/protection: Post-menopausal    Comment: 40 years marriage  Other Topics Concern   Not on  file  Social History Narrative   11/26/19   From: [place]   Living: with wife Jana Half   Work: retired from [employer]       Family: Daughter - Jinny Blossom (married, 1 son, lives in [place])      Enjoys: golf, fishing, digging for bottles      Exercise: walking [place] 6-7 days a week   Diet: trying to do better       Safety   Seat belts: Yes    Guns: Yes  and secure   Safe in relationships: Yes    Social Determinants of Health   Financial Resource Strain: Low Risk  (07/12/2022)   Overall Financial Resource Strain (CARDIA)    Difficulty of Paying Living Expenses: Not hard at all  Food Insecurity: No Food Insecurity (07/12/2022)   Hunger Vital Sign    Worried About Running Out of Food in the Last Year: Never true    [place] in the Last Year: Never true  Transportation Needs: No Transportation Needs (07/12/2022)   PRAPARE - Hydrologist (Medical): No  Lack of Transportation (Non-Medical): No  Physical Activity: Insufficiently Active (07/12/2022)   Exercise Vital Sign    Days of Exercise per Week: 3 days    Minutes of Exercise per Session: 30 min  Stress: No Stress Concern Present (07/12/2022)   [place]    Feeling of Stress : Not at all  Social Connections: [place] (07/12/2022)   Social Connection and Isolation Panel [NHANES]    Frequency of Communication with Friends and Family: More than three times a week    Frequency of Social Gatherings with Friends and Family: More than three times a week    Attends Religious Services: More than 4 times per year    Active Member of [employer] or Organizations: Yes    Attends Music therapist: More than 4 times per year    Marital Status: Married  Human resources officer Violence: Not At Risk (07/12/2022)   Humiliation, Afraid, Rape, and Kick questionnaire    Fear  of Current or Ex-Partner: No    Emotionally Abused: No    Physically Abused: No    Sexually Abused: No    Outpatient Medications Prior to Visit  Medication Sig Dispense Refill   atorvastatin (LIPITOR) 20 MG tablet TAKE 1 TABLET (20 MG TOTAL) BY MOUTH EVERY EVENING. FOR CHOLESTEROL. 90 tablet 3   blood glucose meter kit and supplies Check sugar once daily. DX E11.9 Contour Next meter and supplies 1 each 12   CONTOUR NEXT TEST test strip CHECK SUGAR ONCE DAILY. DX E11.9 100 strip 12   glimepiride (AMARYL) 4 MG tablet TAKE 1 TABLET BY MOUTH EVERY DAY 90 tablet 1   lisinopril (ZESTRIL) 20 MG tablet TAKE 1 TABLET BY MOUTH EVERY DAY 90 tablet 1   metFORMIN (GLUCOPHAGE-XR) 500 MG 24 hr tablet Take 2 tablets (1,000 mg total) by mouth 2 (two) times daily with a meal. 360 tablet 3   pantoprazole (PROTONIX) 40 MG tablet Take 1 tablet (40 mg total) by mouth daily. 90 tablet 0   No facility-administered medications prior to visit.    Allergies  Allergen Reactions   Penicillins    Shrimp [Shellfish Allergy] Hives   Sulfa Antibiotics         Objective:    Physical Exam Constitutional:      Appearance: Normal appearance. He is obese.  Cardiovascular:     Rate and Rhythm: Normal rate and regular rhythm.  Pulmonary:     Effort: Pulmonary effort is normal.     Breath sounds: Normal breath sounds.  Abdominal:     General: Abdomen is flat.  Neurological:     General: No focal deficit present.     Mental Status: He is alert and oriented to person, place, and time. Mental status is at baseline.  Psychiatric:        Mood and Affect: Mood normal.        Behavior: Behavior normal.        Thought Content: Thought content normal.        Judgment: Judgment normal.       BP 132/78   Pulse 85   Temp 98.6 F (37 C)   Resp 16   Ht 6' (1.829 m)   Wt 290 lb 8 oz (131.8 kg)   SpO2 95%   BMI 39.40 kg/m  Wt Readings from Last 3 Encounters:  07/23/22 290 lb 8 oz (131.8 kg)  07/12/22 279 lb  (126.6 kg)  01/17/22 279 lb  6 oz (126.7 kg)     Health Maintenance Due  Topic Date Due   Diabetic kidney evaluation - Urine ACR  03/03/2020   Diabetic kidney evaluation - GFR measurement  03/02/2022   FOOT EXAM  03/02/2022    There are no preventive care reminders to display for this patient.  Lab Results  Component Value Date   TSH 2.690 03/04/2019   Lab Results  Component Value Date   WBC 8.3 03/04/2019   HGB 13.5 03/04/2019   HCT 40.0 03/04/2019   MCV 85 03/04/2019   PLT 247 03/04/2019   Lab Results  Component Value Date   NA 138 03/02/2021   K 4.8 03/02/2021   CO2 26 03/02/2021   GLUCOSE 91 03/02/2021   BUN 21 03/02/2021   CREATININE 0.92 03/02/2021   BILITOT 0.4 03/02/2021   ALKPHOS 78 03/02/2021   AST 16 03/02/2021   ALT 17 03/02/2021   PROT 7.5 03/02/2021   ALBUMIN 4.3 03/02/2021   CALCIUM 9.6 03/02/2021   GFR 86.70 03/02/2021   Lab Results  Component Value Date   CHOL 102 03/02/2021   Lab Results  Component Value Date   HDL 45.70 03/02/2021   Lab Results  Component Value Date   LDLCALC 36 03/02/2021   Lab Results  Component Value Date   TRIG 100.0 03/02/2021   Lab Results  Component Value Date   CHOLHDL 2 03/02/2021   Lab Results  Component Value Date   HGBA1C 8.3 (A) 07/23/2022      Assessment & Plan:   Problem List Items Addressed This Visit       Cardiovascular and Mediastinum   HTN (hypertension)    Continue lisinopril 20 mg once daily  Pt advised of the following:  Continue medication as prescribed. Monitor blood pressure periodically and/or when you feel symptomatic. Goal is <130/90 on average. Ensure that you have rested for 30 minutes prior to checking your blood pressure. Record your readings and bring them to your next visit if necessary.work on a low sodium diet.       Relevant Orders   Comprehensive metabolic panel     Digestive   GERD (gastroesophageal reflux disease) - Primary    Try to decrease and or avoid  spicy foods, fried fatty foods, and also caffeine and chocolate as these can increase heartburn symptoms.  rx pantoprazole 40 mg       Relevant Medications   pantoprazole (PROTONIX) 40 MG tablet     Endocrine   Type 2 diabetes mellitus with other specified complication ([place])    Ordered hga1c today pending results. Work on diabetic diet and exercise as tolerated. Yearly foot exam, and annual eye exam.  Continue metformin 1000 mg twice daily  Continue glimeperide 4 mg       Relevant Orders   Comprehensive metabolic panel   CBC   POCT glycosylated hemoglobin (Hb A1C) (Completed)   Hyperlipidemia associated with type 2 diabetes mellitus ([facility])    Ordered lipid panel, pending results. Work on low cholesterol diet and exercise as tolerated rx atorvastatin 20 mg once daily       Relevant Orders   Lipid panel    Meds ordered this encounter  Medications   pantoprazole (PROTONIX) 40 MG tablet    Sig: Take 1 tablet (40 mg total) by mouth daily.    Dispense:  90 tablet    Refill:  1    Follow-up: Return in about 3 months (around 10/23/2022).    Darlys Buis  Phebe Colla, FNP

## 2022-07-23 NOTE — Assessment & Plan Note (Addendum)
Ordered hga1c today pending results. Work on diabetic diet and exercise as tolerated. Yearly foot exam, and annual eye exam.  Continue metformin 1000 mg twice daily  Continue glimeperide 4 mg

## 2022-07-23 NOTE — Assessment & Plan Note (Signed)
Try to decrease and or avoid spicy foods, fried fatty foods, and also caffeine and chocolate as these can increase heartburn symptoms.  rx pantoprazole 40 mg

## 2022-07-23 NOTE — Assessment & Plan Note (Signed)
Ordered lipid panel, pending results. Work on low cholesterol diet and exercise as tolerated rx atorvastatin 20 mg once daily

## 2022-08-01 ENCOUNTER — Other Ambulatory Visit: Payer: Self-pay

## 2022-08-01 DIAGNOSIS — E119 Type 2 diabetes mellitus without complications: Secondary | ICD-10-CM

## 2022-08-01 MED ORDER — METFORMIN HCL ER 500 MG PO TB24: 1000.0000 mg | ORAL_TABLET | Freq: Two times a day (BID) | ORAL | 0 refills | Status: DC

## 2022-08-10 ENCOUNTER — Encounter: Payer: Self-pay | Admitting: Family

## 2022-08-14 NOTE — Telephone Encounter (Signed)
Can we see if pt wants appt?

## 2022-08-15 NOTE — Telephone Encounter (Signed)
Called pt and offered him an appointment, and he said he felt better today and did not think an appointment was necessary. I did advise him if he got to feeling worse to call and make an appointment so we could see him.

## 2022-08-17 ENCOUNTER — Ambulatory Visit (INDEPENDENT_AMBULATORY_CARE_PROVIDER_SITE_OTHER): Payer: Medicare Other | Admitting: Family Medicine

## 2022-08-17 ENCOUNTER — Encounter: Payer: Self-pay | Admitting: Family Medicine

## 2022-08-17 VITALS — BP 134/78 | HR 83 | Temp 97.7°F | Ht 72.0 in | Wt 283.4 lb

## 2022-08-17 DIAGNOSIS — R0981 Nasal congestion: Secondary | ICD-10-CM | POA: Diagnosis not present

## 2022-08-17 DIAGNOSIS — J209 Acute bronchitis, unspecified: Secondary | ICD-10-CM | POA: Insufficient documentation

## 2022-08-17 LAB — POC COVID19 BINAXNOW: SARS Coronavirus 2 Ag: NEGATIVE

## 2022-08-17 MED ORDER — PREDNISONE 10 MG PO TABS: ORAL_TABLET | ORAL | 0 refills | Status: DC

## 2022-08-17 MED ORDER — PROMETHAZINE-DM 6.25-15 MG/5ML PO SYRP: 5.0000 mL | ORAL_SOLUTION | Freq: Three times a day (TID) | ORAL | 0 refills | Status: DC | PRN

## 2022-08-17 NOTE — Progress Notes (Signed)
Subjective:    Patient ID: James Washington, male    DOB: [birth year], 66 y.o.   MRN: [id number]  HPI 66 yo pt of NP Dugal presents with uri symptoms  Wt Readings from Last 3 Encounters:  08/17/22 283 lb 6.4 oz (128.5 kg)  07/23/22 290 lb 8 oz (131.8 kg)  07/12/22 279 lb (126.6 kg)   38.44 kg/m  Was recently traveling ([place]) Returned fri Then started getting some symptoms the next day  ST Congestion   Thought he was feeling better wed Then worse again the next day   Wheezing  Some cough  Worse congestion   No fever  Felt clammy one day   Did covid testing  3 days -all negative   Non smoker   Otc: [place] quil Day quil   Results for orders placed or performed in visit on 08/17/22  POC COVID-19  Result Value Ref Range   SARS Coronavirus 2 Ag Negative Negative    Patient Active Problem List   Diagnosis Date Noted   Acute bronchitis 08/17/2022   Type 2 diabetes mellitus with other specified complication ([place]) 55/21/7471   HTN (hypertension) 06/09/2019   Obesity, morbid ([place]) 06/09/2019   Hyperlipidemia associated with type 2 diabetes mellitus ([place]) 06/09/2019   GERD (gastroesophageal reflux disease) 06/09/2019   At risk for obstructive sleep apnea 06/09/2019   Past Medical History:  Diagnosis Date   COVID-19 02/02/2021   Diabetes ([place])    GERD (gastroesophageal reflux disease)    Hypertension    Past Surgical History:  Procedure Laterality Date   NO PAST SURGERIES     Social History   Tobacco Use   Smoking status: Former    Types: Cigars   Smokeless tobacco: Never   Tobacco comments:     once a month  Vaping Use   Vaping Use: Never used  Substance Use Topics   Alcohol use: Yes    Comment: maybe once a month-beer   Drug use: Never   Family History  Problem Relation Age of Onset   Lung cancer Mother        small cell   Heart attack Mother 66   Diabetes Father    Heart attack Father 28   Heart attack Maternal Grandfather 20   Diabetes  Sister    Heart disease Sister    Heart attack Paternal Grandmother 78   Asthma Paternal Grandmother    Prostate cancer Paternal Grandfather 64   Hypothyroidism Sister    Allergies  Allergen Reactions   Penicillins    Shrimp [Shellfish Allergy] Hives   Sulfa Antibiotics    Current Outpatient Medications on File Prior to Visit  Medication Sig Dispense Refill   atorvastatin (LIPITOR) 20 MG tablet TAKE 1 TABLET (20 MG TOTAL) BY MOUTH EVERY EVENING. FOR CHOLESTEROL. 90 tablet 3   blood glucose meter kit and supplies Check sugar once daily. DX E11.9 Contour Next meter and supplies 1 each 12   CONTOUR NEXT TEST test strip CHECK SUGAR ONCE DAILY. DX E11.9 100 strip 12   glimepiride (AMARYL) 4 MG tablet TAKE 1 TABLET BY MOUTH EVERY DAY 90 tablet 1   lisinopril (ZESTRIL) 20 MG tablet TAKE 1 TABLET BY MOUTH EVERY DAY 90 tablet 1   metFORMIN (GLUCOPHAGE-XR) 500 MG 24 hr tablet Take 2 tablets (1,000 mg total) by mouth 2 (two) times daily with a meal. 360 tablet 0   pantoprazole (PROTONIX) 40 MG tablet Take 1 tablet (40 mg total) by mouth daily.  90 tablet 1   No current facility-administered medications on file prior to visit.    Review of Systems  Constitutional:  Positive for appetite change and fatigue. Negative for fever.  HENT:  Positive for congestion, postnasal drip, rhinorrhea, sinus pressure, sneezing and sore throat. Negative for ear pain.   Eyes:  Negative for pain and discharge.  Respiratory:  Positive for cough and wheezing. Negative for chest tightness, shortness of breath and stridor.   Cardiovascular:  Negative for chest pain.  Gastrointestinal:  Negative for diarrhea, nausea and vomiting.  Genitourinary:  Negative for frequency, hematuria and urgency.  Musculoskeletal:  Negative for arthralgias and myalgias.  Skin:  Negative for rash.  Neurological:  Negative for dizziness, weakness and light-headedness.  Psychiatric/Behavioral:  Negative for confusion and dysphoric mood.         Objective:   Physical Exam Constitutional:      General: He is not in acute distress.    Appearance: Normal appearance. He is well-developed. He is not ill-appearing, toxic-appearing or diaphoretic.  HENT:     Head: Normocephalic and atraumatic.     Comments: Nares are injected and congested    No sinus tenderness    Right Ear: Tympanic membrane, ear canal and external ear normal.     Left Ear: Tympanic membrane, ear canal and external ear normal.     Nose: Congestion and rhinorrhea present.     Mouth/Throat:     Mouth: Mucous membranes are moist.     Pharynx: Oropharynx is clear. No oropharyngeal exudate or posterior oropharyngeal erythema.     Comments: Clear pnd  Eyes:     General:        Right eye: No discharge.        Left eye: No discharge.     Conjunctiva/sclera: Conjunctivae normal.     Pupils: Pupils are equal, round, and reactive to light.  Cardiovascular:     Rate and Rhythm: Normal rate.     Heart sounds: Normal heart sounds.  Pulmonary:     Effort: Pulmonary effort is normal. No respiratory distress.     Breath sounds: Normal breath sounds. No stridor. No wheezing, rhonchi or rales.     Comments: Scattered rhonchi Scant wheeze with forced exp Overall good air exch Chest:     Chest wall: No tenderness.  Musculoskeletal:     Cervical back: Normal range of motion and neck supple.  Lymphadenopathy:     Cervical: No cervical adenopathy.  Skin:    General: Skin is warm and dry.     Capillary Refill: Capillary refill takes less than 2 seconds.     Findings: No rash.  Neurological:     Mental Status: He is alert.     Cranial Nerves: No cranial nerve deficit.  Psychiatric:        Mood and Affect: Mood normal.           Assessment & Plan:   Problem List Items Addressed This Visit       Respiratory   Acute bronchitis    7 days into uri with some wheeze/cough and congestion  All mucous is clear  Neg covid testing and no sob  Reasurring  exam-minimal rhonchi  Px prednisone taper (disc side eff and also poss of inc glucose with this)  Fluids/rest Prometh dm for cough (wared of sedation)  Watch for s/s of bact pna and sinusitis  Update if not starting to improve in a week or if worsening  Other Visit Diagnoses     Congestion of nasal sinus    -  Primary   Relevant Orders   POC COVID-19 (Completed)

## 2022-08-17 NOTE — Patient Instructions (Addendum)
I think you have viral bronchitis  Drink fluids and rest  Treat your symptoms You can try the prometh dm for cough   Take prednisone as directed to help your lung symptoms  It may make you hyper and hungry while you are on it   Update if not starting to improve in a week or if worsening

## 2022-08-17 NOTE — Assessment & Plan Note (Signed)
7 days into uri with some wheeze/cough and congestion  All mucous is clear  Neg covid testing and no sob  Reasurring exam-minimal rhonchi  Px prednisone taper (disc side eff and also poss of inc glucose with this)  Fluids/rest Prometh dm for cough (wared of sedation)  Watch for s/s of bact pna and sinusitis  Update if not starting to improve in a week or if worsening

## 2022-08-22 ENCOUNTER — Telehealth: Payer: Self-pay | Admitting: Family

## 2022-08-22 MED ORDER — PROMETHAZINE-DM 6.25-15 MG/5ML PO SYRP: 5.0000 mL | ORAL_SOLUTION | Freq: Three times a day (TID) | ORAL | 0 refills | Status: DC | PRN

## 2022-08-22 NOTE — Telephone Encounter (Signed)
Pt notified of Dr. Royden Purl comments and that Rx was sent into pharmacy

## 2022-08-22 NOTE — Telephone Encounter (Signed)
Pt states he does not want to run out over the [place]. He is still coughing at night.

## 2022-08-22 NOTE — Telephone Encounter (Signed)
  Encourage patient to contact the pharmacy for refills or they can request refills through [facility]  LAST APPOINTMENT DATE:  Please schedule appointment if longer than 1 year  NEXT APPOINTMENT DATE:  MEDICATION:promethazine-dextromethorphan (PROMETHAZINE-DM) 6.25-15 MG/5ML syrup   Is the patient out of medication?   PHARMACY: [id number] - [place], [place] - [address]    Let patient know to contact pharmacy at the end of the day to make sure medication is ready.  Please notify patient to allow 48-72 hours to process  CLINICAL FILLS OUT ALL BELOW:   LAST REFILL:  QTY:  REFILL DATE:    OTHER COMMENTS:    Okay for refill?  Please advise

## 2022-08-22 NOTE — Telephone Encounter (Signed)
I sent it  Follow up if no further improvement

## 2022-08-28 NOTE — Progress Notes (Unsigned)
    James Solimine T. Rosene Pilling, MD, CAQ Sports Medicine [facility] at [facility] [address] [place], [address]  Phone: [phone]  FAX: [phone]  [name] - 66 y.o. male  MRN [id number]  Date of Birth: 02/16/1956  Date: 08/29/2022  PCP: James Sawyers, FNP  Referral: James Sawyers, FNP  No chief complaint on file.  Subjective:   James Washington is a 66 y.o. very pleasant male patient with There is no height or weight on file to calculate BMI. who presents with the following:  Patient presents with an acute illness.  He was previously seen by my partner Dr. Milinda Antis, and at that point was diagnosed with bronchitis and was placed on a prednisone taper.  He is here today in follow-up to discuss some ongoing symptoms.  He initially was seen on August 17, 2022.    Review of Systems is noted in the HPI, as appropriate  Objective:   There were no vitals taken for this visit.  GEN: No acute distress; alert,appropriate. PULM: Breathing comfortably in no respiratory distress PSYCH: Normally interactive.   Laboratory and Imaging Data:  Assessment and Plan:   ***

## 2022-08-29 ENCOUNTER — Telehealth: Payer: Self-pay | Admitting: *Deleted

## 2022-08-29 ENCOUNTER — Ambulatory Visit (INDEPENDENT_AMBULATORY_CARE_PROVIDER_SITE_OTHER): Payer: Medicare Other | Admitting: Family Medicine

## 2022-08-29 ENCOUNTER — Encounter: Payer: Self-pay | Admitting: Family Medicine

## 2022-08-29 VITALS — BP 124/70 | HR 103 | Temp 97.9°F | Ht 72.0 in | Wt 288.0 lb

## 2022-08-29 DIAGNOSIS — E119 Type 2 diabetes mellitus without complications: Secondary | ICD-10-CM

## 2022-08-29 DIAGNOSIS — J208 Acute bronchitis due to other specified organisms: Secondary | ICD-10-CM | POA: Diagnosis not present

## 2022-08-29 MED ORDER — DOXYCYCLINE HYCLATE 100 MG PO TABS: 100.0000 mg | ORAL_TABLET | Freq: Two times a day (BID) | ORAL | 0 refills | Status: DC

## 2022-08-29 NOTE — Telephone Encounter (Signed)
James Washington was in today seeing Dr. Patsy Lager.  While in the office he mentioned that when he last saw James Washington, he thought she told him to increase his Glimepiride 4 mg to taking 2 tablets daily but he could not find where that was mentioned in his last office note.  I also reviewed last office visit with James Washington it it stated to continue taking Glimepiride 4 mg daily.  He states he has been taking 2 tablets daily (8 mg) but wanted clarification from James Washington because he really thought she told him to increase the dose.   Please advise.

## 2022-08-30 MED ORDER — GLIMEPIRIDE 4 MG PO TABS: 8.0000 mg | ORAL_TABLET | Freq: Every day | ORAL | 1 refills | Status: DC

## 2022-08-30 NOTE — Telephone Encounter (Signed)
Yes, pt was advised per lab results to increase glimepiride to two tablets once daily for total of 8 mg. Please call pt to clarify.

## 2022-08-30 NOTE — Telephone Encounter (Signed)
Mr. Koral notified as instructed by telephone.  Patient requested refill with new dosing instructions.  Refill sent as requested.  Medication list updated.

## 2022-08-30 NOTE — Addendum Note (Signed)
Addended by: Damita Lack on: 08/30/2022 08:40 AM   Modules accepted: Orders

## 2022-09-03 ENCOUNTER — Other Ambulatory Visit: Payer: Self-pay

## 2022-09-03 DIAGNOSIS — I10 Essential (primary) hypertension: Secondary | ICD-10-CM

## 2022-09-03 NOTE — Telephone Encounter (Signed)
Last visit 07/23/2022 Next visit 10/23/2022

## 2022-09-04 MED ORDER — LISINOPRIL 20 MG PO TABS: 20.0000 mg | ORAL_TABLET | Freq: Every day | ORAL | 1 refills | Status: DC

## 2022-10-23 ENCOUNTER — Telehealth: Payer: Self-pay | Admitting: Family

## 2022-10-23 ENCOUNTER — Ambulatory Visit (INDEPENDENT_AMBULATORY_CARE_PROVIDER_SITE_OTHER)
Admission: RE | Admit: 2022-10-23 | Discharge: 2022-10-23 | Disposition: A | Discharge status: Home / Self Care | Payer: Medicare Other | Source: Ambulatory Visit | Attending: Family | Admitting: Family

## 2022-10-23 ENCOUNTER — Ambulatory Visit (INDEPENDENT_AMBULATORY_CARE_PROVIDER_SITE_OTHER): Payer: Medicare Other | Admitting: Family

## 2022-10-23 ENCOUNTER — Other Ambulatory Visit: Payer: Self-pay

## 2022-10-23 ENCOUNTER — Encounter: Payer: Self-pay | Admitting: Family

## 2022-10-23 VITALS — BP 126/80 | HR 85 | Temp 98.8°F | Ht 72.0 in | Wt 291.6 lb

## 2022-10-23 DIAGNOSIS — L509 Urticaria, unspecified: Secondary | ICD-10-CM

## 2022-10-23 DIAGNOSIS — E661 Drug-induced obesity: Secondary | ICD-10-CM | POA: Insufficient documentation

## 2022-10-23 DIAGNOSIS — S99922A Unspecified injury of left foot, initial encounter: Secondary | ICD-10-CM | POA: Diagnosis not present

## 2022-10-23 DIAGNOSIS — E7849 Other hyperlipidemia: Secondary | ICD-10-CM

## 2022-10-23 DIAGNOSIS — E1169 Type 2 diabetes mellitus with other specified complication: Secondary | ICD-10-CM

## 2022-10-23 DIAGNOSIS — M79675 Pain in left toe(s): Secondary | ICD-10-CM | POA: Insufficient documentation

## 2022-10-23 DIAGNOSIS — I10 Essential (primary) hypertension: Secondary | ICD-10-CM

## 2022-10-23 DIAGNOSIS — Z6839 Body mass index (BMI) 39.0-39.9, adult: Secondary | ICD-10-CM

## 2022-10-23 LAB — MICROALBUMIN / CREATININE URINE RATIO
Creatinine,U: 49.9 mg/dL
Microalb Creat Ratio: 1.4 mg/g (ref 0.0–30.0)
Microalb, Ur: <0.7 mg/dL (ref 0.0–1.9)

## 2022-10-23 LAB — POCT GLYCOSYLATED HEMOGLOBIN (HGB A1C): Hemoglobin A1C: 8.6 % — AB (ref 4.0–5.6)

## 2022-10-23 MED ORDER — ATORVASTATIN CALCIUM 20 MG PO TABS: 20.0000 mg | ORAL_TABLET | Freq: Every evening | ORAL | 1 refills | Status: DC

## 2022-10-23 MED ORDER — TRIAMCINOLONE ACETONIDE 0.1 % EX CREA: 1.0000 | TOPICAL_CREAM | Freq: Two times a day (BID) | CUTANEOUS | 0 refills | Status: DC

## 2022-10-23 MED ORDER — OZEMPIC (0.25 OR 0.5 MG/DOSE) 2 MG/1.5ML ~~LOC~~ SOPN: PEN_INJECTOR | SUBCUTANEOUS | 0 refills | Status: DC

## 2022-10-23 NOTE — Progress Notes (Signed)
Established Patient Office Visit  Subjective:  Patient ID: James Washington, male    DOB: 1956/09/05  Age: 67 y.o. MRN: [id number]  CC:  Chief Complaint  Patient presents with   Diabetes    HPI James Washington is here today for follow up.   Pt is with acute concerns.  DM2: glimepiride 8 mg once daily , 4 mg bid as well as metformin 1000 mg bid . Tolerating well. He does admit he doesn't think his diet is that great. Last eye exam for diabetes was within the last one year. Foot exam due today.     Lab Results  Component Value Date   HGBA1C 8.6 (A) 10/23/2022    Left foot, left 4th metatarsal with some tenderness. Ran into a door about four weeks ago. Finds it to be painful and hard to walk on due to pain when he has been walking around. Wife has been taping his toes together for some support which helps at times.   Fyi, lesion taken off left eye 10/08/22. Biopsy was negative.   Past Medical History:  Diagnosis Date   COVID-19 02/02/2021   Diabetes ([place])    GERD (gastroesophageal reflux disease)    Hypertension     Past Surgical History:  Procedure Laterality Date   eye lesion Left    removed, biopsy negative    Family History  Problem Relation Age of Onset   Lung cancer Mother        small cell   Heart attack Mother 34   Diabetes Father    Heart attack Father 84   Heart attack Maternal Grandfather 35   Diabetes Sister    Heart disease Sister    Heart attack Paternal Grandmother 13   Asthma Paternal Grandmother    Prostate cancer Paternal Grandfather 85   Hypothyroidism Sister     Social History   Socioeconomic History   Marital status: Married    Spouse name: Jana Half   Number of children: 1   Years of education: BS from [employer] education level: Not on file  Occupational History   Occupation: Retired    Fish farm manager: [place]  Tobacco Use   Smoking status: Former    Types: Cigars   Smokeless tobacco: Never   Tobacco comments:      once a month  Vaping Use   Vaping Use: Never used  Substance and Sexual Activity   Alcohol use: Yes    Comment: maybe once a month-beer   Drug use: Never   Sexual activity: Yes    Partners: Female    Birth control/protection: Post-menopausal    Comment: 77 years marriage  Other Topics Concern   Not on file  Social History Narrative   11/26/19   From: [place]   Living: with wife Jana Half   Work: retired from [employer]       Family: Daughter - Jinny Blossom (married, 1 son, lives in [place])      Enjoys: golf, fishing, digging for bottles      Exercise: walking [place] 6-7 days a week   Diet: trying to do better       Safety   Seat belts: Yes    Guns: Yes  and secure   Safe in relationships: Yes    Social Determinants of Health   Financial Resource Strain: Low Risk  (07/12/2022)   Overall Financial Resource Strain (CARDIA)    Difficulty  of Paying Living Expenses: Not hard at all  Food Insecurity: No Food Insecurity (07/12/2022)   Hunger Vital Sign    Worried About Running Out of Food in the Last Year: Never true    Ran Out of Food in the Last Year: Never true  Transportation Needs: No Transportation Needs (07/12/2022)   PRAPARE - Administrator, Civil Service (Medical): No    Lack of Transportation (Non-Medical): No  Physical Activity: Insufficiently Active (07/12/2022)   Exercise Vital Sign    Days of Exercise per Week: 3 days    Minutes of Exercise per Session: 30 min  Stress: No Stress Concern Present (07/12/2022)   [employer] of Occupational Health - Occupational Stress Questionnaire    Feeling of Stress : Not at all  Social Connections: Socially Integrated (07/12/2022)   Social Connection and Isolation Panel [NHANES]    Frequency of Communication with Friends and Family: More than three times a week    Frequency of Social Gatherings with Friends and Family: More than three times a week     Attends Religious Services: More than 4 times per year    Active Member of [employer] or Organizations: Yes    Attends Engineer, structural: More than 4 times per year    Marital Status: Married  Catering manager Violence: Not At Risk (07/12/2022)   Humiliation, Afraid, Rape, and Kick questionnaire    Fear of Current or Ex-Partner: No    Emotionally Abused: No    Physically Abused: No    Sexually Abused: No    Outpatient Medications Prior to Visit  Medication Sig Dispense Refill   atorvastatin (LIPITOR) 20 MG tablet TAKE 1 TABLET (20 MG TOTAL) BY MOUTH EVERY EVENING. FOR CHOLESTEROL. 90 tablet 3   blood glucose meter kit and supplies Check sugar once daily. DX E11.9 Contour Next meter and supplies 1 each 12   CONTOUR NEXT TEST test strip CHECK SUGAR ONCE DAILY. DX E11.9 100 strip 12   glimepiride (AMARYL) 4 MG tablet Take 2 tablets (8 mg total) by mouth daily. 180 tablet 1   lisinopril (ZESTRIL) 20 MG tablet Take 1 tablet (20 mg total) by mouth daily. 90 tablet 1   metFORMIN (GLUCOPHAGE-XR) 500 MG 24 hr tablet Take 2 tablets (1,000 mg total) by mouth 2 (two) times daily with a meal. 360 tablet 0   pantoprazole (PROTONIX) 40 MG tablet Take 1 tablet (40 mg total) by mouth daily. 90 tablet 1   doxycycline (VIBRA-TABS) 100 MG tablet Take 1 tablet (100 mg total) by mouth 2 (two) times daily. (Patient not taking: Reported on 10/23/2022) 20 tablet 0   promethazine-dextromethorphan (PROMETHAZINE-DM) 6.25-15 MG/5ML syrup Take 5 mLs by mouth 3 (three) times daily as needed for cough. Caution of sedation 118 mL 0   No facility-administered medications prior to visit.    Allergies  Allergen Reactions   Penicillins    Shrimp [Shellfish Allergy] Hives   Sulfa Antibiotics       ROS: Pertinent symptoms negative unless otherwise noted in HPI      Objective:    Physical Exam Constitutional:      Appearance: Normal appearance. He is obese.  HENT:     Head: Normocephalic.   Cardiovascular:     Rate and Rhythm: Normal rate.  Pulmonary:     Effort: Pulmonary effort is normal.  Musculoskeletal:        General: Normal range of motion.  Neurological:     General: No focal  deficit present.     Mental Status: He is alert and oriented to person, place, and time. Mental status is at baseline.  Psychiatric:        Mood and Affect: Mood normal.        Behavior: Behavior normal.        Thought Content: Thought content normal.        Judgment: Judgment normal.      Diabetic Foot Exam - Simple   Simple Foot Form Diabetic Foot exam was performed with the following findings: Yes 10/23/2022 10:34 AM  Visual Inspection No deformities, no ulcerations, no other skin breakdown bilaterally: Yes Sensation Testing Intact to touch and monofilament testing bilaterally: Yes Pulse Check Posterior Tibialis and Dorsalis pulse intact bilaterally: Yes Comments    Diabetic Foot Form - Detailed   Diabetic Foot Exam - detailed Diabetic Foot exam was performed with the following findings: Yes 10/23/2022 10:34 AM  Visual Foot Exam completed.: Yes   Pulse Foot Exam completed.: Yes  Sensory Foot Exam Completed.: Yes Semmes-Weinstein Monofilament Test         BP 126/80   Pulse 85   Temp 98.8 F (37.1 C) (Oral)   Ht 6' (1.829 m)   Wt 291 lb 9.6 oz (132.3 kg)   SpO2 98%   BMI 39.55 kg/m  Wt Readings from Last 3 Encounters:  10/23/22 291 lb 9.6 oz (132.3 kg)  08/29/22 288 lb (130.6 kg)  08/17/22 283 lb 6.4 oz (128.5 kg)     Health Maintenance Due  Topic Date Due   DTaP/Tdap/Td (2 - Td or Tdap) 10/02/2019   Diabetic kidney evaluation - Urine ACR  03/03/2020    There are no preventive care reminders to display for this patient.  Lab Results  Component Value Date   TSH 2.690 03/04/2019   Lab Results  Component Value Date   WBC 8.7 07/23/2022   HGB 13.5 07/23/2022   HCT 41.2 07/23/2022   MCV 87.2 07/23/2022   PLT 204.0 07/23/2022   Lab Results   Component Value Date   NA 138 07/23/2022   K 4.9 07/23/2022   CO2 29 07/23/2022   GLUCOSE 109 (H) 07/23/2022   BUN 14 07/23/2022   CREATININE 0.82 07/23/2022   BILITOT 0.4 07/23/2022   ALKPHOS 85 07/23/2022   AST 17 07/23/2022   ALT 19 07/23/2022   PROT 6.9 07/23/2022   ALBUMIN 4.3 07/23/2022   CALCIUM 9.4 07/23/2022   GFR 90.66 07/23/2022   Lab Results  Component Value Date   CHOL 100 07/23/2022   Lab Results  Component Value Date   HDL 42.50 07/23/2022   Lab Results  Component Value Date   LDLCALC 40 07/23/2022   Lab Results  Component Value Date   TRIG 83.0 07/23/2022   Lab Results  Component Value Date   CHOLHDL 2 07/23/2022   Lab Results  Component Value Date   HGBA1C 8.6 (A) 10/23/2022      Assessment & Plan:   Type 2 diabetes mellitus with other specified complication, without long-term current use of insulin (HCC) Assessment & Plan: Ordered hga1c today pending results. Work on diabetic diet and exercise as tolerated. Advised to keep up to date with annual eye exam. Foot exam completed today.   Pt advised to:   I sent in a prescription for a weight loss medication called ozempic If approved you can start this as prescribed. Sometimes we need to complete a prior auth prior to approval.  If this  is not approved, I will try another medication. If not approved,  I will have to refer you to our weight loss clinic where they might have better approval odds, and can help you on your weight loss journey.    If approved, you will start 0.25 mg once weekly for four weeks, then increase to 5.0 mg once weekly if tolerating well after four weeks. Be sure to make a follow up appointment in three months after starting the medication so we can document weight loss, and monitor how you are doing on the medication.   Bring with you your new diet plan and weight weigh ins at least once weekly to document your journey.   Some apps to look at our myfitnesspal and or the  NOOM app.  Also look at [email or web address] if you prefer pre made meals that are delivered to your door step   Orders: -     POCT glycosylated hemoglobin (Hb A1C) -     Microalbumin / creatinine urine ratio -     Ozempic (0.25 or 0.5 MG/DOSE); Inject 0.25 mg into the skin once a week for 30 days, THEN 0.5 mg once a week.  Dispense: 3.75 mL; Refill: 0  Urticaria -     Triamcinolone Acetonide; Apply 1 Application topically 2 (two) times daily.  Dispense: 30 g; Refill: 0  Pain in left toe(s) Assessment & Plan: Due to injury, not improving.  Xray today pending results.  Wear supportive shoes/ ice heat to site.  Orders: -     DG Foot Complete Left; Future  Injury of toe on left foot, initial encounter -     DG Foot Complete Left; Future  Class 2 severe obesity due to excess calories with serious comorbidity and body mass index (BMI) of 39.0 to 39.9 in adult (HCC) -     Ozempic (0.25 or 0.5 MG/DOSE); Inject 0.25 mg into the skin once a week for 30 days, THEN 0.5 mg once a week.  Dispense: 3.75 mL; Refill: 0  Primary hypertension Assessment & Plan: Stable.       Meds ordered this encounter  Medications   triamcinolone cream (KENALOG) 0.1 %    Sig: Apply 1 Application topically 2 (two) times daily.    Dispense:  30 g    Refill:  0    Order Specific Question:   Supervising Provider    Answer:   BEDSOLE, AMY E [[id number]]   Semaglutide,0.25 or 0.5MG /DOS, (OZEMPIC, 0.25 OR 0.5 MG/DOSE,) 2 MG/1.5ML SOPN    Sig: Inject 0.25 mg into the skin once a week for 30 days, THEN 0.5 mg once a week.    Dispense:  3.75 mL    Refill:  0    Order Specific Question:   Supervising Provider    Answer:   Diona Browner, AMY E [[id number]]    Follow-up: Return in about 3 months (around 01/22/2023) for f/u diabetes.    Eugenia Pancoast, FNP

## 2022-10-23 NOTE — Assessment & Plan Note (Signed)
Pt advised to work on diet and exercise as tolerated  

## 2022-10-23 NOTE — Assessment & Plan Note (Signed)
Due to injury, not improving.  Xray today pending results.  Wear supportive shoes/ ice heat to site.

## 2022-10-23 NOTE — Assessment & Plan Note (Signed)
Stable

## 2022-10-23 NOTE — Patient Instructions (Addendum)
------------------------------------  I sent in a prescription for a weight loss medication called ozempic.  If approved you can start this as prescribed. Sometimes we need to complete a prior auth prior to approval.  If this is not approved, I will try another medication. If not approved,  I will have to refer you to our weight loss clinic where they might have better approval odds, and can help you on your weight loss journey.   If approved, you will start 0.25 mg once weekly for four weeks, then increase to 0.5 mg once weekly if tolerating well after four weeks. Be sure to make a follow up appointment in three months after starting the medication so we can document weight loss, and monitor how you are doing on the medication.   Bring with you your new diet plan and weight weigh ins at least once weekly to document your journey.   Some apps to look at our myfitnesspal and or the NOOM app.  Also look at freshmealplan.com if you prefer pre made meals that are delivered to your door step  ------------------------------------  Complete xray(s) prior to leaving today. I will notify you of your results once received.  Leave a urine before you go, we test for urine microalbumin.   Regards,   Sunil Hue FNP-C   ------------------------------------ Budget-Friendly Healthy Eating There are many ways to save money at the grocery store and continue to eat healthy. You can be successful if you: Plan meals according to your budget. Make a grocery list and only purchase food according to your grocery list. Prepare food yourself at home. What are tips for following this plan? Reading food labels Compare food labels between brand name foods and the store brand. Often the nutritional value is the same, but the store brand is lower cost. Look for products that do not have added sugar, fat, or salt (sodium). These often cost the same but are healthier for you. Products may be labeled  as: Sugar-free. Nonfat. Low-fat. Sodium-free. Low-sodium. Look for lean ground beef labeled as at least 92% lean and 8% fat. Shopping  Buy only the items on your grocery list and go only to the areas of the store that have the items on your list. Use coupons only for foods and brands you normally buy. Avoid buying items you wouldn't normally buy simply because they are on sale. Check online and in newspapers for weekly deals. Buy healthy items from the bulk bins when available, such as herbs, spices, flour, pasta, nuts, and dried fruit. Buy fruits and vegetables that are in season. Prices are usually lower on in-season produce. Look at the unit price on the price tag. Use it to compare different brands and sizes to find out which item is the best deal. Choose healthy items that are often low-cost, such as carrots, potatoes, apples, bananas, and oranges. Dried or canned beans are a low-cost protein source. Buy in bulk and freeze extra food. Items you can buy in bulk include meats, fish, poultry, frozen fruits, and frozen vegetables. Avoid buying "ready-to-eat" foods, such as pre-cut fruits and vegetables and pre-made salads. If possible, shop around to discover where you can find the best prices. Consider other retailers such as dollar stores, larger [employer], local fruit and vegetable stands, and farmers markets. Do not shop when you are hungry. If you shop while hungry, it may be hard to stick to your list and budget. Resist impulse buying. Use your grocery list as your official plan  for the week. Buy a variety of vegetables and fruits by purchasing fresh, frozen, and canned items. Look at the top and bottom shelves for deals. Foods at eye level (eye level of an adult or child) are usually more expensive. Be efficient with your time when shopping. The more time you spend at the store, the more money you are likely to spend. To save money when choosing more expensive foods like meats  and dairy: Choose cheaper cuts of meat, such as bone-in chicken thighs and drumsticks instead of skinless and boneless chicken. When you are ready to prepare the chicken, you can remove the skin yourself to make it healthier. Choose lean meats like chicken or [place] instead of beef. Choose canned seafood, such as tuna, salmon, or sardines. Buy eggs as a low-cost source of protein. Buy dried beans and peas, such as lentils, split peas, or kidney beans instead of meats. Dried beans and peas are a good alternative source of protein. Buy the larger tubs of yogurt instead of individual-sized containers. Choose water instead of sodas and other sweetened beverages. Avoid buying chips, cookies, and other "junk food." These items are usually expensive and not healthy. Cooking Make extra food and freeze the extras in meal-sized containers or in individual portions for fast meals and snacks. Pre-cook on days when you have extra time to prepare meals in advance. You can keep these meals in the fridge or freezer and reheat for a quick meal. When you come home from the grocery store, wash, peel, and cut fruits and vegetables so they are ready to use and eat. This will help reduce food waste. Meal planning Do not eat out or get fast food. Prepare food at home. Make a grocery list and make sure to bring it with you to the store. If you have a smart phone, you could use your phone to create your shopping list. Plan meals and snacks according to a grocery list and budget you create. Use leftovers in your meal plan for the week. Look for recipes where you can cook once and make enough food for two meals. Prepare budget-friendly types of meals like stews, casseroles, and stir-fry dishes. Try some meatless meals or try "no cook" meals like salads. Make sure that half your plate is filled with fruits or vegetables. Choose from fresh, frozen, or canned fruits and vegetables. If eating canned, remember to rinse them  before eating. This will remove any excess salt added for packaging. Summary Eating healthy on a budget is possible if you plan your meals according to your budget, purchase according to your budget and grocery list, and prepare food yourself. Tips for buying more food on a limited budget include buying generic brands, using coupons only for foods you normally buy, and buying healthy items from the bulk bins when available. Tips for buying cheaper food to replace expensive food include choosing cheaper, lean cuts of meat, and buying dried beans and peas. This information is not intended to replace advice given to you by your health care provider. Make sure you discuss any questions you have with your health care provider. Document Revised: 06/30/2020 Document Reviewed: 06/30/2020 Elsevier Patient Education  2023 Else[employer] Inc.  ------------------------------------ Exercising to [employer] Getting regular exercise is important for everyone. It is especially important if you are overweight. Being overweight increases your risk of heart disease, stroke, diabetes, high blood pressure, and several types of cancer. Exercising, and reducing the calories you consume, can help you lose weight and  improve fitness and health. Exercise can be moderate or vigorous intensity. To lose weight, most people need to do a certain amount of moderate or vigorous-intensity exercise each week. How can exercise affect me? You lose weight when you exercise enough to burn more calories than you eat. Exercise also reduces body fat and builds muscle. The more muscle you have, the more calories you burn. Exercise also: Improves mood. Reduces stress and tension. Improves your overall fitness, flexibility, and endurance. Increases bone strength. Moderate-intensity exercise  Moderate-intensity exercise is any activity that gets you moving enough to burn at least three times more energy (calories) than if you were  sitting. Examples of moderate exercise include: Walking a mile in 15 minutes. Doing light yard work. Biking at an easy pace. Most people should get at least 150 minutes of moderate-intensity exercise a week to maintain their body weight. Vigorous-intensity exercise Vigorous-intensity exercise is any activity that gets you moving enough to burn at least six times more calories than if you were sitting. When you exercise at this intensity, you should be working hard enough that you are not able to carry on a conversation. Examples of vigorous exercise include: Running. Playing a team sport, such as football, basketball, and soccer. Jumping rope. Most people should get at least 75 minutes a week of vigorous exercise to maintain their body weight. What actions can I take to lose weight? The amount of exercise you need to lose weight depends on: Your age. The type of exercise. Any health conditions you have. Your overall physical ability. Talk to your health care provider about how much exercise you need and what types of activities are safe for you. Nutrition  Make changes to your diet as told by your health care provider or diet and nutrition specialist (dietitian). This may include: Eating fewer calories. Eating more protein. Eating less unhealthy fats. Eating a diet that includes fresh fruits and vegetables, whole grains, low-fat dairy products, and lean protein. Avoiding foods with added fat, salt, and sugar. Drink plenty of water while you exercise to prevent dehydration or heat stroke. Activity Choose an activity that you enjoy and set realistic goals. Your health care provider can help you make an exercise plan that works for you. Exercise at a moderate or vigorous intensity most days of the week. The intensity of exercise may vary from person to person. You can tell how intense a workout is for you by paying attention to your breathing and heartbeat. Most people will notice their  breathing and heartbeat get faster with more intense exercise. Do resistance training twice each week, such as: Push-ups. Sit-ups. Lifting weights. Using resistance bands. Getting short amounts of exercise can be just as helpful as long, structured periods of exercise. If you have trouble finding time to exercise, try doing these things as part of your daily routine: Get up, stretch, and walk around every 30 minutes throughout the day. Go for a walk during your lunch break. Park your car farther away from your destination. If you take public transportation, get off one stop early and walk the rest of the way. Make phone calls while standing up and walking around. Take the stairs instead of elevators or escalators. Wear comfortable clothes and shoes with good support. Do not exercise so much that you hurt yourself, feel dizzy, or get very short of breath. Where to find more information [place]. Department of Health and Human Services: [employer]at Centers for Disease Control and Prevention: http://www.wolf.info/ Contact a health care  provider: Before starting a new exercise program. If you have questions or concerns about your weight. If you have a medical problem that keeps you from exercising. Get help right away if: You have any of the following while exercising: Injury. Dizziness. Difficulty breathing or shortness of breath that does not go away when you stop exercising. Chest pain. Rapid heartbeat. These symptoms may represent a serious problem that is an emergency. Do not wait to see if the symptoms will go away. Get medical help right away. Call your local emergency services (911 in the [place] Do not drive yourself to the hospital. Summary Getting regular exercise is especially important if you are overweight. Being overweight increases your risk of heart disease, stroke, diabetes, high blood pressure, and several types of cancer. Losing weight happens when you burn more calories than you  eat. Reducing the amount of calories you eat, and getting regular moderate or vigorous exercise each week, helps you lose weight. This information is not intended to replace advice given to you by your health care provider. Make sure you discuss any questions you have with your health care provider. Document Revised: 11/13/2020 Document Reviewed: 11/13/2020 Elsevier Patient Education  [employer].

## 2022-10-23 NOTE — Assessment & Plan Note (Signed)
Ordered hga1c today pending results. Work on diabetic diet and exercise as tolerated. Advised to keep up to date with annual eye exam. Foot exam completed today.   Pt advised to:   I sent in a prescription for a weight loss medication called ozempic If approved you can start this as prescribed. Sometimes we need to complete a prior auth prior to approval.  If this is not approved, I will try another medication. If not approved,  I will have to refer you to our weight loss clinic where they might have better approval odds, and can help you on your weight loss journey.    If approved, you will start 0.25 mg once weekly for four weeks, then increase to 5.0 mg once weekly if tolerating well after four weeks. Be sure to make a follow up appointment in three months after starting the medication so we can document weight loss, and monitor how you are doing on the medication.   Bring with you your new diet plan and weight weigh ins at least once weekly to document your journey.   Some apps to look at our myfitnesspal and or the NOOM app.  Also look at freshmealplan.com if you prefer pre made meals that are delivered to your door step

## 2022-10-23 NOTE — Telephone Encounter (Signed)
Prescription Request  10/23/2022  Is this a "Controlled Substance" medicine? No  LOV: 10/23/2022  What is the name of the medication or equipment?   atorvastatin (LIPITOR) 20 MG tablet  Have you contacted your pharmacy to request a refill? Yes   Which pharmacy would you like this sent to?  [facility] [phone] [facility] - [address] AT [facility] [place] [address] Phone: [phone] Fax: [phone]  [id number] [place], [place] [address] [place] [address] Phone: [phone] Fax: [phone]    Patient notified that their request is being sent to the clinical staff for review and that they should receive a response within 2 business days.   Please advise at Mobile [phone] (mobile)

## 2022-10-25 ENCOUNTER — Other Ambulatory Visit: Payer: Self-pay | Admitting: Family

## 2022-10-25 DIAGNOSIS — S92515G Nondisplaced fracture of proximal phalanx of left lesser toe(s), subsequent encounter for fracture with delayed healing: Secondary | ICD-10-CM

## 2022-10-25 NOTE — Progress Notes (Signed)
Your toe does have a nondisplaced fracture which accounts for the pain. I will refer you to orthopedist as you likely need a boot for stability.

## 2022-10-28 ENCOUNTER — Other Ambulatory Visit: Payer: Self-pay | Admitting: Family

## 2022-10-28 DIAGNOSIS — E119 Type 2 diabetes mellitus without complications: Secondary | ICD-10-CM

## 2022-10-30 ENCOUNTER — Encounter: Payer: Self-pay | Admitting: Orthopedic Surgery

## 2022-10-30 ENCOUNTER — Ambulatory Visit (INDEPENDENT_AMBULATORY_CARE_PROVIDER_SITE_OTHER): Payer: Medicare Other | Admitting: Orthopedic Surgery

## 2022-10-30 DIAGNOSIS — M6702 Short Achilles tendon (acquired), left ankle: Secondary | ICD-10-CM

## 2022-10-30 DIAGNOSIS — S92515A Nondisplaced fracture of proximal phalanx of left lesser toe(s), initial encounter for closed fracture: Secondary | ICD-10-CM

## 2022-10-30 NOTE — Progress Notes (Signed)
Office Visit Note   Patient: James Washington           Date of Birth: 1956-03-16           MRN: [id number] Visit Date: 10/30/2022              Requested by: Eugenia Pancoast, [place],  [place] [address] PCP: Eugenia Pancoast, FNP  Chief Complaint  Patient presents with   Left Foot - Pain    Nondisplaced fx 4th MT       HPI: Patient is a 67 year old gentleman is seen for initial evaluation for nondisplaced fracture base of proximal phalanx left fourth toe.  Assessment & Plan: Visit Diagnoses:  1. Closed nondisplaced fracture of proximal phalanx of lesser toe of left foot, initial encounter   2. Achilles tendon contracture, left     Plan: Patient was given instructions and demonstrated Achilles stretching.  Recommended a stiff soled shoe to unload pressure from the toes.  Follow-Up Instructions: Return if symptoms worsen or fail to improve.   Ortho Exam  Patient is alert, oriented, no adenopathy, well-dressed, normal affect, normal respiratory effort. Examination patient has palpable pulses.  With his knee extended he has dorsiflexion 20 degrees short of neutral with Achilles contracture.  He is tender to palpation at the fourth metatarsal phalangeal joint.  Radiograph shows a fracture through the base of the proximal phalanx fourth toe.  Most recent hemoglobin A1c is 8.6.  Imaging: No results found. No images are attached to the encounter.  Labs: Lab Results  Component Value Date   HGBA1C 8.6 (A) 10/23/2022   HGBA1C 8.3 (A) 07/23/2022   HGBA1C 7.4 (A) 01/17/2022   LABURIC 4.8 03/04/2019     Lab Results  Component Value Date   ALBUMIN 4.3 07/23/2022   ALBUMIN 4.3 03/02/2021   ALBUMIN 4.3 02/17/2020    Lab Results  Component Value Date   MG 2.1 09/03/2019   No results found for: "VD25OH"  No results found for: "PREALBUMIN"    Latest Ref Rng & Units 07/23/2022   10:49 AM 03/04/2019    9:30 AM  CBC EXTENDED  WBC 4.0 - 10.5 K/uL  8.7  8.3   RBC 4.22 - 5.81 Mil/uL 4.72  4.69   Hemoglobin 13.0 - 17.0 g/dL 13.5  13.5   HCT 39.0 - 52.0 % 41.2  40.0   Platelets 150.0 - 400.0 K/uL 204.0  247   NEUT# x10E3/uL  5.7   Lymph# x10E3/uL  1.9      There is no height or weight on file to calculate BMI.  Orders:  No orders of the defined types were placed in this encounter.  No orders of the defined types were placed in this encounter.    Procedures: No procedures performed  Clinical Data: No additional findings.  ROS:  All other systems negative, except as noted in the HPI. Review of Systems  Objective: Vital Signs: There were no vitals taken for this visit.  Specialty Comments:  No specialty comments available.  PMFS History: Patient Active Problem List   Diagnosis Date Noted   Class 2 drug-induced obesity with serious comorbidity and body mass index (BMI) of 39.0 to 39.9 in adult 10/23/2022   Type 2 diabetes mellitus with other specified complication ([place]) 60/07/9322   HTN (hypertension) 06/09/2019   Hyperlipidemia associated with type 2 diabetes mellitus ([place]) 06/09/2019   GERD (gastroesophageal reflux disease) 06/09/2019   At risk for obstructive sleep apnea  06/09/2019   Past Medical History:  Diagnosis Date   COVID-19 02/02/2021   Diabetes ([place])    GERD (gastroesophageal reflux disease)    Hypertension     Family History  Problem Relation Age of Onset   Lung cancer Mother        small cell   Heart attack Mother 2   Diabetes Father    Heart attack Father 42   Heart attack Maternal Grandfather 73   Diabetes Sister    Heart disease Sister    Heart attack Paternal Grandmother 33   Asthma Paternal Grandmother    Prostate cancer Paternal Grandfather 38   Hypothyroidism Sister     Past Surgical History:  Procedure Laterality Date   eye lesion Left    removed, biopsy negative   Social History   Occupational History   Occupation: Retired    Fish farm manager: [employer]  Tobacco Use    Smoking status: Former    Types: Cigars   Smokeless tobacco: Never   Tobacco comments:     once a month  Vaping Use   Vaping Use: Never used  Substance and Sexual Activity   Alcohol use: Yes    Comment: maybe once a month-beer   Drug use: Never   Sexual activity: Yes    Partners: Female    Birth control/protection: Post-menopausal    Comment: 65 years marriage

## 2022-11-07 ENCOUNTER — Telehealth: Payer: Self-pay | Admitting: Family

## 2022-11-07 ENCOUNTER — Other Ambulatory Visit: Payer: Self-pay

## 2022-11-07 DIAGNOSIS — K219 Gastro-esophageal reflux disease without esophagitis: Secondary | ICD-10-CM

## 2022-11-07 MED ORDER — CONTOUR NEXT TEST VI STRP: ORAL_STRIP | 12 refills | Status: DC

## 2022-11-07 MED ORDER — PANTOPRAZOLE SODIUM 40 MG PO TBEC: 40.0000 mg | DELAYED_RELEASE_TABLET | Freq: Every day | ORAL | 1 refills | Status: DC

## 2022-11-07 NOTE — Telephone Encounter (Signed)
Prescription Request  11/07/2022  Is this a "Controlled Substance" medicine? No  LOV: 10/23/2022  What is the name of the medication or equipment? pantoprazole (PROTONIX) 40 MG tablet   Have you contacted your pharmacy to request a refill? Yes   Which pharmacy would you like this sent to?  [id number] [address], [place] [address] Phone: [phone] Fax: [phone]    Patient notified that their request is being sent to the clinical staff for review and that they should receive a response within 2 business days.   Please advise at Mobile [phone] (mobile)

## 2022-11-28 ENCOUNTER — Other Ambulatory Visit: Payer: Self-pay | Admitting: Family

## 2022-11-28 DIAGNOSIS — E66812 Obesity, class 2: Secondary | ICD-10-CM

## 2022-11-28 DIAGNOSIS — E1169 Type 2 diabetes mellitus with other specified complication: Secondary | ICD-10-CM

## 2022-11-29 NOTE — Telephone Encounter (Signed)
Is pt taking ozempic 0.25 mg or 0.5 mg weekly?

## 2022-11-30 NOTE — Telephone Encounter (Signed)
Refill request Semaglutide,0.25 or 0.'5MG'$ /DOS, (OZEMPIC, 0.25 OR 0.5 MG/DOSE,) 2 MG/1.5ML SOPN. LV- 10/23/22; NV- 01/22/23; LR-  10/23/22 (30 day/ no refill)

## 2022-12-03 ENCOUNTER — Telehealth: Payer: Self-pay | Admitting: Family

## 2022-12-03 DIAGNOSIS — E1169 Type 2 diabetes mellitus with other specified complication: Secondary | ICD-10-CM

## 2022-12-03 NOTE — Telephone Encounter (Signed)
Pt called wanting to let Dugal know that his Trulicity would be approved & is asking if Phebe Colla can send prescription? [id number] [place]

## 2022-12-03 NOTE — Telephone Encounter (Signed)
[facility] and its on back order. He was wondering if there was somewhere else it could be sent to or if there was a replacement he could take. Please advise. Thank you!

## 2022-12-03 NOTE — Telephone Encounter (Signed)
Can we ask pt to call his insurance and inquire which GLP1 would be covered? This is the same class as the ozempic but this will help [place] in what can be covered when sent in.

## 2022-12-03 NOTE — Telephone Encounter (Signed)
Spoke with the patient and advised if he could contact his insurance company to verify which medication in the same class would be covered. He will reach out to them and contact our office back.

## 2022-12-04 MED ORDER — TRULICITY 0.75 MG/0.5ML ~~LOC~~ SOAJ: 0.7500 mg | SUBCUTANEOUS | 2 refills | Status: DC

## 2022-12-04 NOTE — Addendum Note (Signed)
Addended by: Eugenia Pancoast on: 12/04/2022 01:35 PM   Modules accepted: Orders

## 2023-01-22 ENCOUNTER — Encounter: Payer: Self-pay | Admitting: Family

## 2023-01-22 ENCOUNTER — Telehealth (INDEPENDENT_AMBULATORY_CARE_PROVIDER_SITE_OTHER): Payer: Medicare Other | Admitting: Family

## 2023-01-22 VITALS — BP 120/80 | HR 79 | Temp 98.1°F | Ht 72.0 in | Wt 281.2 lb

## 2023-01-22 DIAGNOSIS — Z79899 Other long term (current) drug therapy: Secondary | ICD-10-CM

## 2023-01-22 DIAGNOSIS — E785 Hyperlipidemia, unspecified: Secondary | ICD-10-CM | POA: Diagnosis not present

## 2023-01-22 DIAGNOSIS — I10 Essential (primary) hypertension: Secondary | ICD-10-CM | POA: Diagnosis not present

## 2023-01-22 DIAGNOSIS — E1169 Type 2 diabetes mellitus with other specified complication: Secondary | ICD-10-CM

## 2023-01-22 LAB — POCT GLYCOSYLATED HEMOGLOBIN (HGB A1C): Hemoglobin A1C: 7.3 % — AB (ref 4.0–5.6)

## 2023-01-22 MED ORDER — OZEMPIC (0.25 OR 0.5 MG/DOSE) 2 MG/3ML ~~LOC~~ SOPN
0.5000 mg | PEN_INJECTOR | SUBCUTANEOUS | 2 refills | Status: DC
Start: 2023-01-22 — End: 2023-04-12

## 2023-01-22 NOTE — Progress Notes (Unsigned)
Virtual Visit via Video note  I connected with James Washington on 01/23/23 is currently located at [facility]. The provider, Mort Sawyers, FNP is located in their home at time of visit.  I discussed the limitations, risks, security and privacy concerns of performing an evaluation and management service by video and the availability of in person appointments. I also discussed with the patient that there may be a patient responsible charge related to this service. The patient expressed understanding and agreed to proceed.  Subjective: PCP: Mort Sawyers, FNP  Chief Complaint  Patient presents with   Medical Management of Chronic Issues    HPI  DM2:  On glimepiride 4 mg (two tablets ) daily, metformin XR 500 mg twice daily, and trulicity 0.75 mg weekly. He states on trulicity he has having more nausea than with ozempic (which he had tried for a few weeks and tolerated much better but was not available at the time). He wants to switch back to ozempic due to side effects.   Exercise: not much as he would like. Will work on this.  Diet: less portions, doing well with GLP1 decreased appetite, less cravings.  Lab Results  Component Value Date   HGBA1C 7.3 (A) 01/22/2023   Wt Readings from Last 3 Encounters:  01/22/23 281 lb 3.2 oz (127.6 kg)  10/23/22 291 lb 9.6 oz (132.3 kg)  08/29/22 288 lb (130.6 kg)      ROS: Per HPI  Current Outpatient Medications:    atorvastatin (LIPITOR) 20 MG tablet, Take 1 tablet (20 mg total) by mouth every evening. For cholesterol., Disp: 90 tablet, Rfl: 1   blood glucose meter kit and supplies, Check sugar once daily. DX E11.9 Contour Next meter and supplies, Disp: 1 each, Rfl: 12   glimepiride (AMARYL) 4 MG tablet, Take 2 tablets (8 mg total) by mouth daily., Disp: 180 tablet, Rfl: 1   glucose blood (CONTOUR NEXT TEST) test strip, Use as instructed, Disp: 100 strip, Rfl: 12   lisinopril (ZESTRIL) 20 MG tablet, Take 1 tablet  (20 mg total) by mouth daily., Disp: 90 tablet, Rfl: 1   metFORMIN (GLUCOPHAGE-XR) 500 MG 24 hr tablet, TAKE 2 TABLETS BY MOUTH TWICE A DAY WITH MEALS, Disp: 360 tablet, Rfl: 0   pantoprazole (PROTONIX) 40 MG tablet, Take 1 tablet (40 mg total) by mouth daily., Disp: 90 tablet, Rfl: 1   Semaglutide,0.25 or 0.5MG /DOS, (OZEMPIC, 0.25 OR 0.5 MG/DOSE,) 2 MG/3ML SOPN, Inject 0.5 mg into the skin once a week., Disp: 3 mL, Rfl: 2   triamcinolone cream (KENALOG) 0.1 %, Apply 1 Application topically 2 (two) times daily., Disp: 30 g, Rfl: 0  Observations/Objective: Physical Exam Constitutional:      General: He is not in acute distress.    Appearance: Normal appearance. He is obese. He is not ill-appearing, toxic-appearing or diaphoretic.  Cardiovascular:     Rate and Rhythm: Normal rate.  Pulmonary:     Effort: Pulmonary effort is normal.  Musculoskeletal:        General: Normal range of motion.  Neurological:     General: No focal deficit present.     Mental Status: He is alert and oriented to person, place, and time. Mental status is at baseline.  Psychiatric:        Mood and Affect: Mood normal.        Behavior: Behavior normal.        Thought Content: Thought content normal.  Judgment: Judgment normal.     Assessment and Plan: Type 2 diabetes mellitus with other specified complication, without long-term current use of insulin Assessment & Plan: Not tolerating trulicity but tolerates ozempic Sending in rx for ozempic 0.25 mg to start weekly injection.  A1c today reviewed in office.  Advised to continue to work on diet and exercise  Continue glimepiride 4 mg, metformin XR 500 mg twice daily   Orders: -     POCT glycosylated hemoglobin (Hb A1C) -     Ozempic (0.25 or 0.5 MG/DOSE); Inject 0.5 mg into the skin once a week.  Dispense: 3 mL; Refill: 2 -     Hemoglobin A1c; Future -     Comprehensive metabolic panel; Future  Hyperlipidemia associated with type 2 diabetes  mellitus Assessment & Plan: Ordered lipid panel, pending results. Work on low cholesterol diet and exercise as tolerated Continue atorvastatin 20 mg nightly   Orders: -     Lipid panel; Future -     Comprehensive metabolic panel; Future  On statin therapy -     Comprehensive metabolic panel; Future  Primary hypertension Assessment & Plan: Stable  Continue lisinopril 20 mg once daily      Follow Up Instructions: Return in about 6 months (around 07/24/2023) for f/u diabetes, f/u CPE.   I discussed the assessment and treatment plan with the patient. The patient was provided an opportunity to ask questions and all were answered. The patient agreed with the plan and demonstrated an understanding of the instructions.   The patient was advised to call back or seek an in-person evaluation if the symptoms worsen or if the condition fails to improve as anticipated.  The above assessment and management plan was discussed with the patient. The patient verbalized understanding of and has agreed to the management plan. Patient is aware to call the clinic if symptoms persist or worsen. Patient is aware when to return to the clinic for a follow-up visit. Patient educated on when it is appropriate to go to the emergency department.     I provided 22 minutes of face-to-face time during this encounter.   Mort Sawyers, MSN, APRN, FNP-C [facility]

## 2023-01-22 NOTE — Patient Instructions (Signed)
  Stop trulicity start ozempic if covered If having issues getting this medication let me know.   Lab only in three months office visit in 6 months.   Come fasting to your lab appt in three months.    Regards,   Mort Sawyers FNP-C

## 2023-01-23 NOTE — Assessment & Plan Note (Signed)
Stable  Continue lisinopril 20 mg once daily

## 2023-01-23 NOTE — Assessment & Plan Note (Signed)
Ordered lipid panel, pending results. Work on low cholesterol diet and exercise as tolerated Continue atorvastatin 20 mg nightly

## 2023-01-23 NOTE — Assessment & Plan Note (Signed)
Not tolerating trulicity but tolerates ozempic Sending in rx for ozempic 0.25 mg to start weekly injection.  A1c today reviewed in office.  Advised to continue to work on diet and exercise  Continue glimepiride 4 mg, metformin XR 500 mg twice daily

## 2023-01-27 ENCOUNTER — Other Ambulatory Visit: Payer: Self-pay | Admitting: Family

## 2023-01-27 DIAGNOSIS — E119 Type 2 diabetes mellitus without complications: Secondary | ICD-10-CM

## 2023-02-28 ENCOUNTER — Other Ambulatory Visit: Payer: Self-pay | Admitting: Family

## 2023-02-28 DIAGNOSIS — E119 Type 2 diabetes mellitus without complications: Secondary | ICD-10-CM

## 2023-03-02 ENCOUNTER — Other Ambulatory Visit: Payer: Self-pay | Admitting: Family

## 2023-03-02 DIAGNOSIS — I10 Essential (primary) hypertension: Secondary | ICD-10-CM

## 2023-03-26 ENCOUNTER — Other Ambulatory Visit: Payer: Self-pay | Admitting: Family

## 2023-03-26 DIAGNOSIS — E119 Type 2 diabetes mellitus without complications: Secondary | ICD-10-CM

## 2023-04-11 ENCOUNTER — Other Ambulatory Visit: Payer: Self-pay | Admitting: Family

## 2023-04-11 ENCOUNTER — Encounter: Payer: Self-pay | Admitting: Family

## 2023-04-11 DIAGNOSIS — E1169 Type 2 diabetes mellitus with other specified complication: Secondary | ICD-10-CM

## 2023-04-12 MED ORDER — OZEMPIC (0.25 OR 0.5 MG/DOSE) 2 MG/3ML ~~LOC~~ SOPN
0.5000 mg | PEN_INJECTOR | SUBCUTANEOUS | 1 refills | Status: DC
Start: 2023-04-12 — End: 2023-07-08

## 2023-04-18 ENCOUNTER — Other Ambulatory Visit: Payer: Self-pay | Admitting: Family

## 2023-04-18 DIAGNOSIS — E7849 Other hyperlipidemia: Secondary | ICD-10-CM

## 2023-04-23 ENCOUNTER — Other Ambulatory Visit: Payer: Medicare Other

## 2023-05-27 ENCOUNTER — Encounter: Payer: Self-pay | Admitting: Family

## 2023-05-27 ENCOUNTER — Other Ambulatory Visit: Payer: Self-pay | Admitting: Family

## 2023-05-27 DIAGNOSIS — U071 COVID-19: Secondary | ICD-10-CM

## 2023-05-27 MED ORDER — MOLNUPIRAVIR 200 MG PO CAPS
4.0000 | ORAL_CAPSULE | Freq: Two times a day (BID) | ORAL | 0 refills | Status: AC
Start: 2023-05-27 — End: 2023-06-01

## 2023-05-29 MED ORDER — NIRMATRELVIR/RITONAVIR (PAXLOVID)TABLET: 3.0000 | ORAL_TABLET | Freq: Two times a day (BID) | ORAL | 0 refills | Status: AC

## 2023-06-15 ENCOUNTER — Other Ambulatory Visit: Payer: Self-pay | Admitting: Family

## 2023-06-15 DIAGNOSIS — K219 Gastro-esophageal reflux disease without esophagitis: Secondary | ICD-10-CM

## 2023-06-17 ENCOUNTER — Other Ambulatory Visit: Payer: Self-pay | Admitting: Family

## 2023-06-17 MED ORDER — CONTOUR NEXT TEST VI STRP: ORAL_STRIP | 12 refills | Status: AC

## 2023-06-17 NOTE — Telephone Encounter (Signed)
Last OV: 01/22/2023 Pending OV: 07/24/2023

## 2023-07-04 ENCOUNTER — Other Ambulatory Visit: Payer: Self-pay | Admitting: Family

## 2023-07-04 DIAGNOSIS — E1169 Type 2 diabetes mellitus with other specified complication: Secondary | ICD-10-CM

## 2023-07-08 MED ORDER — SEMAGLUTIDE (1 MG/DOSE) 4 MG/3ML ~~LOC~~ SOPN: 1.0000 mg | PEN_INJECTOR | SUBCUTANEOUS | 2 refills | Status: DC

## 2023-07-16 ENCOUNTER — Ambulatory Visit (INDEPENDENT_AMBULATORY_CARE_PROVIDER_SITE_OTHER): Payer: Medicare Other

## 2023-07-16 VITALS — Ht 73.0 in | Wt 274.0 lb

## 2023-07-16 DIAGNOSIS — Z0000 Encounter for general adult medical examination without abnormal findings: Secondary | ICD-10-CM | POA: Diagnosis not present

## 2023-07-16 NOTE — Progress Notes (Signed)
Subjective:   James Washington is a 67 y.o. male who presents for Medicare Annual/Subsequent preventive examination.  Visit Complete: Virtual I connected with  Rena Sweeden Wrisley on 07/16/23 by a audio enabled telemedicine application and verified that I am speaking with the correct person using two identifiers.  Patient Location: Home  Provider Location: Home Office  I discussed the limitations of evaluation and management by telemedicine. The patient expressed understanding and agreed to proceed.  Vital Signs: Because this visit was a virtual/telehealth visit, some criteria may be missing or patient reported. Any vitals not documented were not able to be obtained and vitals that have been documented are patient reported.  Patient Medicare AWV questionnaire was completed by the patient on 07/16/2023; I have confirmed that all information answered by patient is correct and no changes since this date.  Cardiac Risk Factors include: advanced age (>16men, >92 women);diabetes mellitus;dyslipidemia;male gender;hypertension     Objective:    Today's Vitals   07/16/23 1334  Weight: 274 lb (124.3 kg)  Height: 6\' 1"  (1.854 m)   Body mass index is 36.15 kg/m.     07/16/2023    1:38 PM 07/12/2022   12:48 PM 07/03/2021    9:18 AM 12/20/2020    9:24 AM  Advanced Directives  Does Patient Have a Medical Advance Directive? Yes Yes No No  Type of Estate agent of [place];Living will Healthcare Power of [place];Living will    Copy of Healthcare Power of Attorney in Chart? No - copy requested No - copy requested    Would patient like information on creating a medical advance directive?   Yes (MAU/Ambulatory/Procedural Areas - Information given) No - Patient declined    Current Medications (verified) Outpatient Encounter Medications as of 07/16/2023  Medication Sig   atorvastatin (LIPITOR) 20 MG tablet TAKE 1 TABLET BY MOUTH EVERY EVENING FOR CHOLESTEROL   blood  glucose meter kit and supplies Check sugar once daily. DX E11.9 Contour Next meter and supplies   glimepiride (AMARYL) 4 MG tablet TAKE 2 TABLETS BY MOUTH DAILY.   glucose blood (CONTOUR NEXT TEST) test strip Check sugar once daily   lisinopril (ZESTRIL) 20 MG tablet TAKE 1 TABLET BY MOUTH EVERY DAY   metFORMIN (GLUCOPHAGE-XR) 500 MG 24 hr tablet TAKE 2 TABLETS BY MOUTH TWICE A DAY WITH MEALS   pantoprazole (PROTONIX) 40 MG tablet TAKE 1 TABLET BY MOUTH EVERY DAY   Semaglutide, 1 MG/DOSE, 4 MG/3ML SOPN Inject 1 mg as directed once a week.   triamcinolone cream (KENALOG) 0.1 % Apply 1 Application topically 2 (two) times daily.   No facility-administered encounter medications on file as of 07/16/2023.    Allergies (verified) Penicillins, Shrimp [shellfish allergy], Sulfa antibiotics, and Trulicity [dulaglutide]   History: Past Medical History:  Diagnosis Date   COVID-19 02/02/2021   Diabetes (HCC)    GERD (gastroesophageal reflux disease)    Hypertension    Past Surgical History:  Procedure Laterality Date   eye lesion Left    removed, biopsy negative   Family History  Problem Relation Age of Onset   Lung cancer Mother        small cell   Heart attack Mother 76   Diabetes Father    Heart attack Father 54   Heart attack Maternal Grandfather 65   Diabetes Sister    Heart disease Sister    Heart attack Paternal Grandmother 2   Asthma Paternal Grandmother    Prostate cancer Paternal Grandfather 19  Hypothyroidism Sister    Social History   Socioeconomic History   Marital status: Married    Spouse name: Johnny Bridge   Number of children: 1   Years of education: BS from [employer] education level: Not on file  Occupational History   Occupation: Retired    Associate Professor: Social worker OF [employer]  Tobacco Use   Smoking status: Former    Types: Cigars   Smokeless tobacco: Never   Tobacco comments:     once a month  Vaping Use   Vaping status: Never Used  Substance and Sexual  Activity   Alcohol use: Yes    Comment: maybe once a month-beer   Drug use: Never   Sexual activity: Yes    Partners: Female    Birth control/protection: Post-menopausal    Comment: 39 years marriage  Other Topics Concern   Not on file  Social History Narrative   11/26/19   From: [place]   Living: with wife Johnny Bridge   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       Family: Daughter - Aundra Millet (married, 1 son, lives in [place])      Enjoys: golf, fishing, digging for bottles      Exercise: walking - [place] 6-7 days a week   Diet: trying to do better       Safety   Seat belts: Yes    Guns: Yes  and secure   Safe in relationships: Yes    Social Determinants of Health   Financial Resource Strain: Low Risk  (07/16/2023)   Overall Financial Resource Strain (CARDIA)    Difficulty of Paying Living Expenses: Not hard at all  Food Insecurity: No Food Insecurity (07/16/2023)   Hunger Vital Sign    Worried About Running Out of Food in the Last Year: Never true    Ran Out of Food in the Last Year: Never true  Transportation Needs: No Transportation Needs (07/16/2023)   PRAPARE - Administrator, Civil Service (Medical): No    Lack of Transportation (Non-Medical): No  Physical Activity: Insufficiently Active (07/16/2023)   Exercise Vital Sign    Days of Exercise per Week: 3 days    Minutes of Exercise per Session: 30 min  Stress: No Stress Concern Present (07/16/2023)   [employer] of Occupational Health - Occupational Stress Questionnaire    Feeling of Stress : Not at all  Social Connections: Socially Integrated (07/16/2023)   Social Connection and Isolation Panel [NHANES]    Frequency of Communication with Friends and Family: More than three times a week    Frequency of Social Gatherings with Friends and Family: More than three times a week    Attends Religious Services: More than 4 times per year    Active Member of  [employer] or Organizations: Yes    Attends Engineer, structural: More than 4 times per year    Marital Status: Married    Tobacco Counseling Counseling given: Not Answered Tobacco comments:  once a month   Clinical Intake:  Pre-visit preparation completed: Yes  Pain : No/denies pain     Nutritional Risks: None Diabetes: Yes CBG done?: No Did pt. bring in CBG monitor from home?: No  How often do you need to have someone help you when you read instructions, pamphlets, or other written materials from your doctor or pharmacy?: 1 - Never  Interpreter Needed?: No  Information entered by :: Renie Ora, LPN  Activities of Daily Living    07/16/2023    1:38 PM 07/16/2023    1:22 PM  In your present state of health, do you have any difficulty performing the following activities:  Hearing? 0 0  Vision? 0 0  Difficulty concentrating or making decisions? 0 0  Walking or climbing stairs? 0 0  Dressing or bathing? 0 0  Doing errands, shopping? 0 0  Preparing Food and eating ? N N  Using the Toilet? N N  In the past six months, have you accidently leaked urine? N N  Do you have problems with loss of bowel control? N N  Managing your Medications? N N  Managing your Finances? N N  Housekeeping or managing your Housekeeping? N N    Patient Care Team: Mort Sawyers, FNP as PCP - General (Family Medicine)  Indicate any recent Medical Services you may have received from other than [facility] providers in the past year (date may be approximate).     Assessment:   This is a routine wellness examination for [facility].  Hearing/Vision screen Vision Screening - Comments:: Wears rx glasses - up to date with routine eye exams with  Dr.Bell   Goals Addressed             This Visit's Progress    Exercise 3x per week (30 min per time)   On track      Depression Screen    07/16/2023    1:37 PM 01/22/2023   11:06 AM 10/23/2022   11:55 AM 07/12/2022   12:45 PM 07/03/2021     9:20 AM 02/02/2021    1:39 PM 12/20/2020    9:25 AM  PHQ 2/9 Scores  PHQ - 2 Score 0 0 1 0 0 0 0  PHQ- 9 Score   8        Fall Risk    07/16/2023    1:35 PM 07/16/2023    1:22 PM 01/22/2023   11:06 AM 10/23/2022   11:55 AM 07/12/2022   12:47 PM  Fall Risk   Falls in the past year? 0 0 0 0 0  Number falls in past yr: 0 0 0 0 0  Injury with Fall? 0 0 0 0 0  Risk for fall due to : No Fall Risks    No Fall Risks  Follow up Falls prevention discussed  Falls evaluation completed;Education provided;Falls prevention discussed Falls evaluation completed;Education provided Falls prevention discussed    MEDICARE RISK AT HOME: Medicare Risk at Home Any stairs in or around the home?: Yes If so, are there any without handrails?: No Home free of loose throw rugs in walkways, pet beds, electrical cords, etc?: Yes Adequate lighting in your home to reduce risk of falls?: Yes Life alert?: No Use of a cane, walker or w/c?: No Grab bars in the bathroom?: Yes Shower chair or bench in shower?: Yes Elevated toilet seat or a handicapped toilet?: Yes  TIMED UP AND GO:  Was the test performed?  No    Cognitive Function:        07/16/2023    1:38 PM 07/12/2022   12:49 PM  6CIT Screen  What Year? 0 points 0 points  What month? 0 points 0 points  What time? 0 points 0 points  Count back from 20 0 points 0 points  Months in reverse 0 points 0 points  Repeat phrase 0 points 0 points  Total Score 0 points 0 points    Immunizations Immunization History  Administered Date(s) Administered   Fluad Quad(high Dose 65+) 07/03/2021, 07/04/2022   Influenza,inj,Quad PF,6+ Mos 07/02/2019, 08/08/2020   Moderna SARS-COV2 Booster Vaccination 07/04/2022   Moderna Sars-Covid-2 Vaccination 11/14/2019, 12/18/2019, 07/27/2020   PFIZER(Purple Top)SARS-COV-2 Vaccination 01/11/2021   Pneumococcal Conjugate-13 02/17/2020   Pneumococcal Polysaccharide-23 07/03/2021   Respiratory Syncytial Virus Vaccine,Recomb  Aduvanted(Arexvy) 07/04/2022   Rsv, Bivalent, Protein Subunit Rsvpref,pf Verdis Frederickson) 07/04/2022   Tdap 10/01/2009    TDAP status: Due, Education has been provided regarding the importance of this vaccine. Advised may receive this vaccine at local pharmacy or Health Dept. Aware to provide a copy of the vaccination record if obtained from local pharmacy or Health Dept. Verbalized acceptance and understanding.  Flu Vaccine status: Due, Education has been provided regarding the importance of this vaccine. Advised may receive this vaccine at local pharmacy or Health Dept. Aware to provide a copy of the vaccination record if obtained from local pharmacy or Health Dept. Verbalized acceptance and understanding.  Pneumococcal vaccine status: Up to date  Covid-19 vaccine status: Completed vaccines  Qualifies for Shingles Vaccine? Yes   Zostavax completed No   Shingrix Completed?: No.    Education has been provided regarding the importance of this vaccine. Patient has been advised to call insurance company to determine out of pocket expense if they have not yet received this vaccine. Advised may also receive vaccine at local pharmacy or Health Dept. Verbalized acceptance and understanding.  Screening Tests Health Maintenance  Topic Date Due   Zoster Vaccines- Shingrix (1 of 2) Never done   DTaP/Tdap/Td (2 - Td or Tdap) 10/02/2019   INFLUENZA VACCINE  05/02/2023   COVID-19 Vaccine (5 - 2023-24 season) 06/02/2023   Diabetic kidney evaluation - eGFR measurement  07/24/2023   OPHTHALMOLOGY EXAM  07/18/2023   HEMOGLOBIN A1C  07/24/2023   Diabetic kidney evaluation - Urine ACR  10/24/2023   FOOT EXAM  10/24/2023   Medicare Annual Wellness (AWV)  07/15/2024   Colonoscopy  11/26/2026   Pneumonia Vaccine 58+ Years old  Completed   Hepatitis C Screening  Completed   HPV VACCINES  Aged Out    Health Maintenance  Health Maintenance Due  Topic Date Due   Zoster Vaccines- Shingrix (1 of 2) Never done    DTaP/Tdap/Td (2 - Td or Tdap) 10/02/2019   INFLUENZA VACCINE  05/02/2023   COVID-19 Vaccine (5 - 2023-24 season) 06/02/2023   Diabetic kidney evaluation - eGFR measurement  07/24/2023    Colorectal cancer screening: Type of screening: Colonoscopy. Completed 11/26/2016. Repeat every 10 years  Lung Cancer Screening: (Low Dose CT Chest recommended if Age 92-80 years, 20 pack-year currently smoking OR have quit w/in 15years.) does not qualify.   Lung Cancer Screening Referral: n/a  Additional Screening:  Hepatitis C Screening: does not qualify; Completed 02/17/2020  Vision Screening: Recommended annual ophthalmology exams for early detection of glaucoma and other disorders of the eye. Is the patient up to date with their annual eye exam?  Yes  Who is the provider or what is the name of the office in which the patient attends annual eye exams? Dr.bell  If pt is not established with a provider, would they like to be referred to a provider to establish care? No .   Dental Screening: Recommended annual dental exams for proper oral hygiene   Community Resource Referral / Chronic Care Management: CRR required this visit?  No   CCM required this visit?  No     Plan:  I have personally reviewed and noted the following in the patient's chart:   Medical and social history Use of alcohol, tobacco or illicit drugs  Current medications and supplements including opioid prescriptions. Patient is not currently taking opioid prescriptions. Functional ability and status Nutritional status Physical activity Advanced directives List of other physicians Hospitalizations, surgeries, and ER visits in previous 12 months Vitals Screenings to include cognitive, depression, and falls Referrals and appointments  In addition, I have reviewed and discussed with patient certain preventive protocols, quality metrics, and best practice recommendations. A written personalized care plan for preventive  services as well as general preventive health recommendations were provided to patient.     Lorrene Reid, LPN   [id number]   After Visit Summary: (MyChart) Due to this being a telephonic visit, the after visit summary with patients personalized plan was offered to patient via MyChart   Nurse Notes: none

## 2023-07-16 NOTE — Patient Instructions (Signed)
James Washington , Thank you for taking time to come for your Medicare Wellness Visit. I appreciate your ongoing commitment to your health goals. Please review the following plan we discussed and let me know if I can assist you in the future.   Referrals/Orders/Follow-Ups/Clinician Recommendations: Aim for 30 minutes of exercise or brisk walking, 6-8 glasses of water, and 5 servings of fruits and vegetables each day.   This is a list of the screening recommended for you and due dates:  Health Maintenance  Topic Date Due   Zoster (Shingles) Vaccine (1 of 2) Never done   DTaP/Tdap/Td vaccine (2 - Td or Tdap) 10/02/2019   Flu Shot  05/02/2023   COVID-19 Vaccine (5 - 2023-24 season) 06/02/2023   Yearly kidney function blood test for diabetes  07/24/2023   Eye exam for diabetics  07/18/2023   Hemoglobin A1C  07/24/2023   Yearly kidney health urinalysis for diabetes  10/24/2023   Complete foot exam   10/24/2023   Medicare Annual Wellness Visit  07/15/2024   Colon Cancer Screening  11/26/2026   Pneumonia Vaccine  Completed   Hepatitis C Screening  Completed   HPV Vaccine  Aged Out    Advanced directives: (Copy Requested) Please bring a copy of your health care power of attorney and living will to the office to be added to your chart at your convenience.  Next Medicare Annual Wellness Visit scheduled for next year: Yes  insert Preventive Care attachment Insert FALL PREVENTION attachment if needed

## 2023-07-22 ENCOUNTER — Other Ambulatory Visit: Payer: Self-pay | Admitting: Family

## 2023-07-22 DIAGNOSIS — E7849 Other hyperlipidemia: Secondary | ICD-10-CM

## 2023-07-24 ENCOUNTER — Ambulatory Visit: Payer: Medicare Other | Admitting: Family

## 2023-07-24 ENCOUNTER — Encounter: Payer: Self-pay | Admitting: Family

## 2023-07-24 ENCOUNTER — Other Ambulatory Visit: Payer: Self-pay | Admitting: Pharmacist

## 2023-07-24 VITALS — BP 122/88 | HR 81 | Temp 97.7°F | Ht 73.0 in | Wt 278.4 lb

## 2023-07-24 DIAGNOSIS — I10 Essential (primary) hypertension: Secondary | ICD-10-CM | POA: Diagnosis not present

## 2023-07-24 DIAGNOSIS — E66812 Obesity, class 2: Secondary | ICD-10-CM

## 2023-07-24 DIAGNOSIS — Z125 Encounter for screening for malignant neoplasm of prostate: Secondary | ICD-10-CM | POA: Diagnosis not present

## 2023-07-24 DIAGNOSIS — E785 Hyperlipidemia, unspecified: Secondary | ICD-10-CM

## 2023-07-24 DIAGNOSIS — Z8042 Family history of malignant neoplasm of prostate: Secondary | ICD-10-CM | POA: Diagnosis not present

## 2023-07-24 DIAGNOSIS — Z6839 Body mass index (BMI) 39.0-39.9, adult: Secondary | ICD-10-CM

## 2023-07-24 DIAGNOSIS — R3916 Straining to void: Secondary | ICD-10-CM | POA: Diagnosis not present

## 2023-07-24 DIAGNOSIS — Z79899 Other long term (current) drug therapy: Secondary | ICD-10-CM | POA: Diagnosis not present

## 2023-07-24 DIAGNOSIS — K219 Gastro-esophageal reflux disease without esophagitis: Secondary | ICD-10-CM

## 2023-07-24 DIAGNOSIS — L409 Psoriasis, unspecified: Secondary | ICD-10-CM

## 2023-07-24 DIAGNOSIS — E1169 Type 2 diabetes mellitus with other specified complication: Secondary | ICD-10-CM

## 2023-07-24 DIAGNOSIS — Z23 Encounter for immunization: Secondary | ICD-10-CM

## 2023-07-24 DIAGNOSIS — E661 Drug-induced obesity: Secondary | ICD-10-CM

## 2023-07-24 LAB — COMPREHENSIVE METABOLIC PANEL
ALT: 18 U/L (ref 0–53)
AST: 17 U/L (ref 0–37)
Albumin: 4.4 g/dL (ref 3.5–5.2)
Alkaline Phosphatase: 72 U/L (ref 39–117)
BUN: 17 mg/dL (ref 6–23)
CO2: 30 meq/L (ref 19–32)
Calcium: 10 mg/dL (ref 8.4–10.5)
Chloride: 100 meq/L (ref 96–112)
Creatinine, Ser: 0.9 mg/dL (ref 0.40–1.50)
GFR: 87.53 mL/min (ref >60.00)
Glucose, Bld: 82 mg/dL (ref 70–99)
Potassium: 4.9 meq/L (ref 3.5–5.1)
Sodium: 137 meq/L (ref 135–145)
Total Bilirubin: 0.7 mg/dL (ref 0.2–1.2)
Total Protein: 7.1 g/dL (ref 6.0–8.3)

## 2023-07-24 LAB — LIPID PANEL
Cholesterol: 114 mg/dL (ref 0–200)
HDL: 46.5 mg/dL (ref >39.00)
LDL Cholesterol: 43 mg/dL (ref 0–99)
NonHDL: 67.66
Total CHOL/HDL Ratio: 2
Triglycerides: 124 mg/dL (ref 0.0–149.0)
VLDL: 24.8 mg/dL (ref 0.0–40.0)

## 2023-07-24 LAB — HEMOGLOBIN A1C: Hgb A1c MFr Bld: 7 % — ABNORMAL HIGH (ref 4.6–6.5)

## 2023-07-24 LAB — PSA: PSA: 1.43 ng/mL (ref 0.10–4.00)

## 2023-07-24 MED ORDER — CLOBETASOL PROPIONATE 0.05 % EX CREA: 1.0000 | TOPICAL_CREAM | Freq: Two times a day (BID) | CUTANEOUS | 0 refills | Status: AC

## 2023-07-24 MED ORDER — SEMAGLUTIDE (1 MG/DOSE) 4 MG/3ML ~~LOC~~ SOPN: 1.0000 mg | PEN_INJECTOR | SUBCUTANEOUS | 2 refills | Status: DC

## 2023-07-24 NOTE — Assessment & Plan Note (Signed)
Continue lisinopril 20 mg ocne daily  Stable today

## 2023-07-24 NOTE — Assessment & Plan Note (Signed)
Apply lightly to entrance to ear canals with Q tip as needed, rx sent in for clobetasol cream

## 2023-07-24 NOTE — Assessment & Plan Note (Signed)
Continue ozempic 1 mg once weekly  Can increase 2 mg after four weeks 1 mg if tolerating well and need more glycemic control

## 2023-07-24 NOTE — Progress Notes (Signed)
Subjective:  Patient ID: James Washington, male    DOB: 11/25/1955  Age: 67 y.o. MRN: [id number]  Patient Care Team: Mort Sawyers, FNP as PCP - General (Family Medicine)   CC:  Chief Complaint  Patient presents with   Diabetes    HPI [name] is a 67 y.o. male who presents today for an annual follow up and f/u on his diabetes. He reports consuming a general diet.  Trying to establish an exercise routine, three to four times a week walking and yard work, golf at times  He generally feels well. He reports sleeping well. He does not have additional problems to discuss today.   Vision:Within last year Dental:Receives regular dental care  Colonoscopy: 2018, repeat in ten years.  Shingles vaccination:   Pt is without acute concerns.   Lab Results  Component Value Date   HGBA1C 7.3 (A) 01/22/2023   DM2: on metformin XR 500 mg twice daily. Recent increase 10/7 to 1 mg and he states appetite suppression. Doing well tolerating well. No nausea vomiting or constipation. Also on amaryll 8 mg once daily.  Wt Readings from Last 3 Encounters:  07/24/23 278 lb 6.4 oz (126.3 kg)  07/16/23 274 lb (124.3 kg)  01/22/23 281 lb 3.2 oz (127.6 kg)   HLD: on atorvastatin 20 mg tolerating well.   Advanced Directives Patient does have advanced directives including living will. He does not have a copy in the electronic medical record.   DEPRESSION SCREENING    07/16/2023    1:37 PM 01/22/2023   11:06 AM 10/23/2022   11:55 AM 07/12/2022   12:45 PM 07/03/2021    9:20 AM 02/02/2021    1:39 PM 12/20/2020    9:25 AM  PHQ 2/9 Scores  PHQ - 2 Score 0 0 1 0 0 0 0  PHQ- 9 Score   8         ROS: Negative unless specifically indicated above in HPI.    Current Outpatient Medications:    atorvastatin (LIPITOR) 20 MG tablet, TAKE 1 TABLET BY MOUTH EVERY EVENING FOR CHOLESTEROL, Disp: 90 tablet, Rfl: 1   blood glucose meter kit and supplies, Check sugar once daily. DX E11.9 Contour Next  meter and supplies, Disp: 1 each, Rfl: 12   clobetasol cream (TEMOVATE) 0.05 %, Apply 1 Application topically 2 (two) times daily., Disp: 30 g, Rfl: 0   glimepiride (AMARYL) 4 MG tablet, TAKE 2 TABLETS BY MOUTH DAILY., Disp: 180 tablet, Rfl: 1   glucose blood (CONTOUR NEXT TEST) test strip, Check sugar once daily, Disp: 100 strip, Rfl: 12   lisinopril (ZESTRIL) 20 MG tablet, TAKE 1 TABLET BY MOUTH EVERY DAY, Disp: 90 tablet, Rfl: 1   metFORMIN (GLUCOPHAGE-XR) 500 MG 24 hr tablet, TAKE 2 TABLETS BY MOUTH TWICE A DAY WITH MEALS, Disp: 360 tablet, Rfl: 3   pantoprazole (PROTONIX) 40 MG tablet, TAKE 1 TABLET BY MOUTH EVERY DAY, Disp: 90 tablet, Rfl: 1   Semaglutide, 1 MG/DOSE, 4 MG/3ML SOPN, Inject 1 mg as directed once a week., Disp: 3 mL, Rfl: 2    Objective:    BP 122/88 (BP Location: Left Arm, Patient Position: Sitting, Cuff Size: Normal)   Pulse 81   Temp 97.7 F (36.5 C) (Temporal)   Ht 6\' 1"  (1.854 m)   Wt 278 lb 6.4 oz (126.3 kg)   SpO2 98%   BMI 36.73 kg/m   BP Readings from Last 3 Encounters:  07/24/23 122/88  01/22/23 120/80  10/23/22 126/80      Physical Exam Vitals reviewed.  Constitutional:      General: He is not in acute distress.    Appearance: Normal appearance. He is obese. He is not ill-appearing, toxic-appearing or diaphoretic.  HENT:     Head: Normocephalic.     Right Ear: Tympanic membrane normal.     Left Ear: Tympanic membrane normal.     Nose: Nose normal.     Mouth/Throat:     Mouth: Mucous membranes are moist.  Eyes:     Pupils: Pupils are equal, round, and reactive to light.  Cardiovascular:     Rate and Rhythm: Normal rate and regular rhythm.  Pulmonary:     Effort: Pulmonary effort is normal.     Breath sounds: Normal breath sounds.  Abdominal:     General: Abdomen is flat.     Tenderness: There is no abdominal tenderness.  Musculoskeletal:        General: Normal range of motion.  Skin:    General: Skin is warm.     Comments: Scaly  flaky skin silver with erythema slight at bil entrance to ear canal   Dry skin noted on bil le  Neurological:     General: No focal deficit present.     Mental Status: He is alert and oriented to person, place, and time. Mental status is at baseline.  Psychiatric:        Mood and Affect: Mood normal.        Behavior: Behavior normal.        Thought Content: Thought content normal.        Judgment: Judgment normal.          Assessment & Plan:  Primary hypertension Assessment & Plan: Continue lisinopril 20 mg ocne daily  Stable today   Need for shingles vaccine  Type 2 diabetes mellitus with other specified complication, without long-term current use of insulin (HCC) Assessment & Plan: Continue ozempic 1 mg once weekly  Can increase 2 mg after four weeks 1 mg if tolerating well and need more glycemic control   Orders: -     Hemoglobin A1c  Class 2 drug-induced obesity with serious comorbidity and body mass index (BMI) of 39.0 to 39.9 in adult -     Semaglutide (1 MG/DOSE); Inject 1 mg as directed once a week.  Dispense: 3 mL; Refill: 2  Hyperlipidemia associated with type 2 diabetes mellitus (HCC) Assessment & Plan: Continue atorvastatin 20 mg once nightly  Ordered lipid panel, pending results. Work on low cholesterol diet and exercise as tolerated   Orders: -     Comprehensive metabolic panel -     Lipid panel  On statin therapy -     Comprehensive metabolic panel  Screening for malignant neoplasm of prostate -     PSA  Family history of malignant neoplasm of prostate -     PSA  Straining to void Assessment & Plan: Very intermittent very slight however will order psa as also with yearly evaluation.   Orders: -     PSA  Psoriasis Assessment & Plan: Apply lightly to entrance to ear canals with Q tip as needed, rx sent in for clobetasol cream  Orders: -     Clobetasol Propionate; Apply 1 Application topically 2 (two) times daily.  Dispense: 30 g;  Refill: 0  Gastroesophageal reflux disease without esophagitis Assessment & Plan: Continue pantoprazole 40 mg once daily. Try to decrease and or avoid spicy  foods, fried fatty foods, and also caffeine and chocolate as these can increase heartburn symptoms.         Follow-up: Return in about 6 months (around 01/22/2024) for f/u diabetes.   Mort Sawyers, FNP

## 2023-07-24 NOTE — Assessment & Plan Note (Signed)
Very intermittent very slight however will order psa as also with yearly evaluation.

## 2023-07-24 NOTE — Assessment & Plan Note (Signed)
Continue pantoprazole 40 mg once daily Try to decrease and or avoid spicy foods, fried fatty foods, and also caffeine and chocolate as these can increase heartburn symptoms.

## 2023-07-24 NOTE — Assessment & Plan Note (Signed)
Continue atorvastatin 20 mg once nightly  Ordered lipid panel, pending results. Work on low cholesterol diet and exercise as tolerated

## 2023-07-24 NOTE — Patient Instructions (Signed)
  Recommendation for shingles and also tetanus vaccination at the pharmacy.

## 2023-07-24 NOTE — Progress Notes (Signed)
   07/24/2023 Name: Colen Renee Bensen MRN: [id number] DOB: 03-17-1956  Subjective   Reason for visit: Ridge Dumond is a 67 y.o. year old male who presented for a telephone visit.   They were referred to the pharmacist by their PCP for assistance in managing medication access.   Care Team: Primary Care Provider: Mort Sawyers, FNP  Reason for visit: ?  Michon Hirota is a 67 y.o. male who presents today for a phone visit with the pharmacist due to medication access concerns regarding their Ozempic. ?   Medication Access: ?  Prescription drug coverage: Payor: MEDICARE / Plan: MEDICARE PART A AND B / Product Type: *No Product type* / .   Reports that all medications are not affordable.  Ozempic copay is high (does not have medication insurance, only medical benefit)  Current Patient Assistance: None   Patient lives in a household of 2 with an estimated annual income of >$60,000.   Medicare LIS Eligible: No  Couples Income Limit $2485/month and Couples Asset Limit: $34,360   Assessment and Plan:   1. Medication Access Cost of Ozempic is high given patient does not have prescription insurance. Patient lives in a household of 2 with an income that significantly exceeds $60,000 per patient report and therefore is not eligible for MAP.  Patient is not eligible for copay cards due to government insurance. Best option regarding brand-name medications is likely patient enrolling in a part D plan moving forward.  Future Appointments  Date Time Provider Department Center  07/20/2024  2:00 PM [facility] [place] VISIT 1 [facility]    Loree Fee, PharmD Clinical Pharmacist [facility] [phone]

## 2023-07-25 ENCOUNTER — Other Ambulatory Visit (HOSPITAL_COMMUNITY): Payer: Self-pay

## 2023-07-25 ENCOUNTER — Telehealth: Payer: Self-pay

## 2023-07-25 ENCOUNTER — Other Ambulatory Visit: Payer: Self-pay | Admitting: Family

## 2023-07-25 DIAGNOSIS — E1169 Type 2 diabetes mellitus with other specified complication: Secondary | ICD-10-CM

## 2023-07-25 NOTE — Telephone Encounter (Signed)
PAP: Application for Ozempic has been submitted to PAP Companies: [employer], Therapist, sports

## 2023-07-29 LAB — HM DIABETES EYE EXAM

## 2023-08-02 NOTE — Progress Notes (Signed)
noted 

## 2023-08-16 ENCOUNTER — Other Ambulatory Visit (HOSPITAL_COMMUNITY): Payer: Self-pay

## 2023-08-16 NOTE — Telephone Encounter (Signed)
Insurance verification information requested from [employer] via fax, form filled out and faxed back.

## 2023-08-25 ENCOUNTER — Other Ambulatory Visit: Payer: Self-pay | Admitting: Family

## 2023-08-25 DIAGNOSIS — I10 Essential (primary) hypertension: Secondary | ICD-10-CM

## 2023-08-27 NOTE — Telephone Encounter (Signed)
PAP: Patient assistance application for Ozempic has been approved by PAP Companies: [employer] from 10/02/2023 to 09/30/2024. Medication should be delivered to PAP Delivery: Provider's office For further shipping updates, please contact [employer] at [phone] Pt ID is: [id number]

## 2023-09-04 ENCOUNTER — Telehealth: Payer: Self-pay | Admitting: Family

## 2023-09-04 NOTE — Telephone Encounter (Signed)
Ozempic was received in the mail on 09/04/2023 from [employer]. #2 boxes were received and placed in the fridge. Lot #: [id number] Exp: 03/30/2026 Pt is aware and will come by to pick the medication. Nothing further was needed.

## 2023-09-10 ENCOUNTER — Other Ambulatory Visit: Payer: Self-pay | Admitting: Family

## 2023-09-10 DIAGNOSIS — E119 Type 2 diabetes mellitus without complications: Secondary | ICD-10-CM

## 2023-10-22 ENCOUNTER — Other Ambulatory Visit: Payer: Self-pay | Admitting: Family

## 2023-10-22 DIAGNOSIS — E66812 Obesity, class 2: Secondary | ICD-10-CM

## 2023-10-22 MED ORDER — SEMAGLUTIDE (1 MG/DOSE) 4 MG/3ML ~~LOC~~ SOPN: 1.0000 mg | PEN_INJECTOR | SUBCUTANEOUS | 2 refills | Status: DC

## 2023-10-22 NOTE — Telephone Encounter (Signed)
Copied from CRM [id number]. Topic: Clinical - Medication Refill >> Oct 22, 2023 12:20 PM Adele Barthel wrote: Most Recent Primary Care Visit:  Provider: Mort Sawyers  Department: [facility]  Visit Type: PHYSICAL  Date: 07/24/2023  Medication: Semaglutide, 1 MG/DOSE, 4 MG/3ML SOPN  Has the patient contacted their pharmacy? No (Agent: If no, request that the patient contact the pharmacy for the refill. If patient does not wish to contact the pharmacy document the reason why and proceed with request.) (Agent: If yes, when and what did the pharmacy advise?)  Is this the correct pharmacy for this prescription? Yes If no, delete pharmacy and type the correct one.  This is the patient's preferred pharmacy:   Medication should be delivered to PAP Delivery: Provider's office. For further shipping updates, please contact [employer] at [phone] Pt ID is: [id number]   Has the prescription been filled recently? Yes  Is the patient out of the medication? No  Has the patient been seen for an appointment in the last year OR does the patient have an upcoming appointment? Yes  Can we respond through MyChart? Yes  Agent: Please be advised that Rx refills may take up to 3 business days. We ask that you follow-up with your pharmacy.

## 2023-11-07 ENCOUNTER — Other Ambulatory Visit: Payer: Self-pay | Admitting: Family

## 2023-11-07 DIAGNOSIS — E66812 Obesity, class 2: Secondary | ICD-10-CM

## 2023-11-07 NOTE — Telephone Encounter (Signed)
 Spoke with pt and advised him that I have not received his shipment for this month. Advised him to contact [employer] to check on the status of his shipment.

## 2023-11-07 NOTE — Telephone Encounter (Signed)
 This RN contacted patient regarding if Ozemic was at the office ready for pick up. This RN advised patient that the office's records show that the medication was last delivered in December and was ready for pick up if patient had not already picked up medication. Patient states that he picked med up in December/January for a two month supply and now needs another refill to be shipped to the office so that he will not be charged for the medication from the pharmacy. Patient is out of his Ozempic . Patient advised by this RN that a note will be routed to the office for their review. Patient verbalized understanding.  Copied from CRM [id number]. Topic: Clinical - Medication Question >> Nov 07, 2023  2:53 PM Joanell B wrote: Reason for CRM: Pt called again and wanted to know the status of his ozempic  he wants to know if the office received it please call pt back at [phone]

## 2023-11-08 ENCOUNTER — Telehealth: Payer: Self-pay

## 2023-11-08 NOTE — Progress Notes (Signed)
 Pharmacy Medication Assistance Program Note    11/08/2023  Patient ID: James Washington, male   DOB: 08/05/1956, 68 y.o.   MRN: [id number]     08/27/2023 11/08/2023  Outreach Medication One  Initial Outreach Date (Medication One)  11/08/2023  Manufacturer Medication One [employer] Nov[employer] Nordisk  Nordisk Drugs Ozempic  Ozempic   Dose of Ozempic  1mg /dose 1MG   Type of Forensic Scientist Assistance  Date Application Sent to Patient  11/08/2023  Application Items Requested  Application  Date Application Sent to Prescriber  11/08/2023  Name of Prescriber  TABITHA DUGAL  Date Application Received From Patient  11/08/2023  Date Application Submitted to Manufacturer 07/25/2023   Method Application Sent to Manufacturer Online Online  Patient Assistance Determination Approved   Approval Start Date 10/02/2023   Approval End Date 09/30/2024      Signature

## 2023-11-08 NOTE — Telephone Encounter (Signed)
 PAP: Application for Ozempic  has been submitted to [employer], Oceanographer portion HAS BEEN FAXED TO PROVIDER OFFICE OF TABITHA DUGAL FOR REVIEW AND PROCESSING

## 2023-11-13 NOTE — Telephone Encounter (Signed)
Received provider pages and have faxed to [employer](OZEMPIC) FOR PROCESSING PAP: PT PAP Application for OZEMPIC has been submitted to [employer], E-FILED   PLEASE BE ADVISED

## 2023-11-13 NOTE — Telephone Encounter (Signed)
Forms signed by [place]. Faxed back to number on fax cover sheet to [place].

## 2023-11-21 ENCOUNTER — Telehealth: Payer: Self-pay | Admitting: Family

## 2023-11-21 NOTE — Telephone Encounter (Signed)
Copied from CRM [id number]. Topic: General - Other >> Nov 20, 2023  3:32 PM James Washington wrote: Reason for CRM: Patient states he received Washington letter from [employer] stating it was incomplete. Patient calling to check for the status or any updates, or what he needs to do on his part.

## 2023-11-22 NOTE — Telephone Encounter (Signed)
Received notification that pt has insurance and [employer] need the signature on pg 1, has corrected and faxed back     Please be advised

## 2023-11-26 NOTE — Telephone Encounter (Signed)
 PAP: Patient assistance application for Ozempic has been approved by PAP Companies: [employer] from 11/25/2023 to 09/30/2024. Medication should be delivered to PAP Delivery: Provider's office. For further shipping updates, please [employer] at [phone]. Patient ID is: [id number]  PLEASE BE ADVISED I HAVE SCANNED APPROVAL LETTER INTO MEDIA    NOTIFIED  PT BY MY CHART

## 2023-12-17 ENCOUNTER — Telehealth: Payer: Self-pay | Admitting: Family

## 2023-12-17 NOTE — Telephone Encounter (Signed)
 Patient picked up medication

## 2023-12-17 NOTE — Telephone Encounter (Signed)
 Ozempic was received in the mail on 12/17/2023 from [employer]. #4 boxes were received and placed in the fridge. Lot #: [id number] Exp: 06/30/2026 He is aware and will come by to pick the medication. Nothing further was needed.

## 2024-01-12 ENCOUNTER — Other Ambulatory Visit: Payer: Self-pay | Admitting: Family

## 2024-01-12 DIAGNOSIS — K219 Gastro-esophageal reflux disease without esophagitis: Secondary | ICD-10-CM

## 2024-01-13 ENCOUNTER — Other Ambulatory Visit: Payer: Self-pay | Admitting: Family

## 2024-01-13 DIAGNOSIS — E7849 Other hyperlipidemia: Secondary | ICD-10-CM

## 2024-01-13 DIAGNOSIS — E119 Type 2 diabetes mellitus without complications: Secondary | ICD-10-CM

## 2024-03-02 ENCOUNTER — Other Ambulatory Visit: Payer: Self-pay | Admitting: Family

## 2024-03-02 DIAGNOSIS — I10 Essential (primary) hypertension: Secondary | ICD-10-CM

## 2024-03-20 ENCOUNTER — Telehealth: Payer: Self-pay

## 2024-03-20 NOTE — Telephone Encounter (Signed)
 Received 4 boxes of ozempic  1 mg for patient this morning. Called patient to let him know they are ready to be picked up. Patient is currently in [place] and will return to pick meds up in about 2 weeks. Advised patient that was fine and they will be here for him when he is able to get them.

## 2024-03-30 NOTE — Telephone Encounter (Signed)
 Pt picked up meds.

## 2024-04-01 ENCOUNTER — Encounter: Payer: Self-pay | Admitting: Family

## 2024-04-17 ENCOUNTER — Encounter: Payer: Self-pay | Admitting: Advanced Practice Midwife

## 2024-05-19 ENCOUNTER — Other Ambulatory Visit: Payer: Self-pay | Admitting: Family

## 2024-05-19 DIAGNOSIS — E119 Type 2 diabetes mellitus without complications: Secondary | ICD-10-CM

## 2024-07-04 ENCOUNTER — Other Ambulatory Visit: Payer: Self-pay | Admitting: Family

## 2024-07-04 DIAGNOSIS — E7849 Other hyperlipidemia: Secondary | ICD-10-CM

## 2024-07-05 ENCOUNTER — Other Ambulatory Visit: Payer: Self-pay | Admitting: Family

## 2024-07-05 DIAGNOSIS — K219 Gastro-esophageal reflux disease without esophagitis: Secondary | ICD-10-CM

## 2024-07-06 ENCOUNTER — Encounter: Payer: Self-pay | Admitting: Pharmacist

## 2024-07-27 ENCOUNTER — Telehealth: Payer: Self-pay | Admitting: Family

## 2024-07-27 NOTE — Telephone Encounter (Signed)
 Copied from CRM [id number]. Topic: Clinical - Medication Question >> Jul 27, 2024 12:29 PM Victoria A wrote: Reason for CRM: Patient would like to know when Ozempic  is arrived at the office so that he can pick it up-please call

## 2024-08-06 ENCOUNTER — Encounter: Payer: Self-pay | Admitting: Family

## 2024-08-06 DIAGNOSIS — Z6839 Body mass index (BMI) 39.0-39.9, adult: Secondary | ICD-10-CM

## 2024-08-07 MED ORDER — SEMAGLUTIDE (1 MG/DOSE) 4 MG/3ML ~~LOC~~ SOPN: 1.0000 mg | PEN_INJECTOR | SUBCUTANEOUS | 2 refills | Status: DC

## 2024-08-11 NOTE — Telephone Encounter (Signed)
 Spoke with pt. States that [facility] location [facility] yesterday to let him know that they got his medication in stock and he picked it up.

## 2024-08-11 NOTE — Telephone Encounter (Signed)
 I think a phone call would make this back and forth easier.  Can you ask if if he wouldn't mind us  sending it somewhere else because if insurance will cover ozempic  I'd rather he stay on it for now.   You can send it to pt preference.  [facility] then I will send mounjaro.

## 2024-08-15 ENCOUNTER — Other Ambulatory Visit: Payer: Self-pay | Admitting: Family

## 2024-08-15 DIAGNOSIS — E119 Type 2 diabetes mellitus without complications: Secondary | ICD-10-CM

## 2024-08-30 ENCOUNTER — Other Ambulatory Visit: Payer: Self-pay | Admitting: Family

## 2024-08-30 DIAGNOSIS — I10 Essential (primary) hypertension: Secondary | ICD-10-CM

## 2024-08-31 ENCOUNTER — Ambulatory Visit

## 2024-08-31 VITALS — BP 122/88 | Ht 73.0 in | Wt 260.0 lb

## 2024-08-31 DIAGNOSIS — I10 Essential (primary) hypertension: Secondary | ICD-10-CM | POA: Diagnosis not present

## 2024-08-31 DIAGNOSIS — Z0000 Encounter for general adult medical examination without abnormal findings: Secondary | ICD-10-CM | POA: Diagnosis not present

## 2024-08-31 MED ORDER — LISINOPRIL 20 MG PO TABS: 20.0000 mg | ORAL_TABLET | Freq: Every day | ORAL | 1 refills | Status: AC

## 2024-08-31 NOTE — Patient Instructions (Signed)
 [name],  Thank you for taking the time for your Medicare Wellness Visit. I appreciate your continued commitment to your health goals. Please review the care plan we discussed, and feel free to reach out if I can assist you further.  Please note that Annual Wellness Visits do not include a physical exam. Some assessments may be limited, especially if the visit was conducted virtually. If needed, we may recommend an in-person follow-up with your provider.  Ongoing Care Seeing your primary care provider every 3 to 6 months helps us  monitor your health and provide consistent, personalized care.   Referrals If a referral was made during today's visit and you haven't received any updates within two weeks, please contact the referred provider directly to check on the status.  Recommended Screenings:  Health Maintenance  Topic Date Due   Zoster (Shingles) Vaccine (1 of 2) Never done   DTaP/Tdap/Td vaccine (2 - Td or Tdap) 10/02/2019   Yearly kidney health urinalysis for diabetes  03/03/2020   Complete foot exam   10/24/2023   Hemoglobin A1C  01/22/2024   Flu Shot  05/01/2024   COVID-19 Vaccine (5 - 2025-26 season) 06/01/2024   Medicare Annual Wellness Visit  07/15/2024   Yearly kidney function blood test for diabetes  07/23/2024   Eye exam for diabetics  07/28/2024   Colon Cancer Screening  11/26/2026   Pneumococcal Vaccine for age over 8  Completed   Hepatitis C Screening  Completed   Meningitis B Vaccine  Aged Out       08/31/2024    6:56 AM  Advanced Directives  Does Patient Have a Medical Advance Directive? Yes  Type of Estate Agent of [place];Out of facility DNR (pink MOST or yellow form);Living will  Does patient want to make changes to medical advance directive? No - Patient declined  Copy of Healthcare Power of Attorney in Chart? No - copy requested    Vision: Annual vision screenings are recommended for early detection of glaucoma, cataracts, and  diabetic retinopathy. These exams can also reveal signs of chronic conditions such as diabetes and high blood pressure.  Dental: Annual dental screenings help detect early signs of oral cancer, gum disease, and other conditions linked to overall health, including heart disease and diabetes.  Please see the attached documents for additional preventive care recommendations.

## 2024-08-31 NOTE — Progress Notes (Signed)
 I connected with  [name] on 08/31/24 by a audio enabled telemedicine application and verified that I am speaking with the correct person using two identifiers.  Patient Location: Home  Provider Location: Home Office  Persons Participating in Visit: Patient.  I discussed the limitations of evaluation and management by telemedicine. The patient expressed understanding and agreed to proceed.  Vital Signs: Because this visit was a virtual/telehealth visit, some criteria may be missing or patient reported. Any vitals not documented were not able to be obtained and vitals that have been documented are patient reported.   Because this visit was a virtual/telehealth visit,  certain criteria was not obtained, such a blood pressure, CBG if applicable, and timed get up and go. Any medications not marked as taking were not mentioned during the medication reconciliation part of the visit. Any vitals not documented were not able to be obtained due to this being a telehealth visit or patient was unable to self-report a recent blood pressure reading due to a lack of equipment at home via telehealth. Vitals that have been documented are verbally provided by the patient.  This visit was performed by a medical professional under my direct supervision. I was immediately available for consultation/collaboration. I have reviewed and agree with the Annual Wellness Visit documentation.  Chief Complaint  Patient presents with   Medicare Wellness     Subjective:   James Washington is a 68 y.o. male who presents for a Medicare Annual Wellness Visit.  Allergies (verified) Penicillins, Shrimp [shellfish allergy], Sulfa antibiotics, and Trulicity  [dulaglutide ]   History: Past Medical History:  Diagnosis Date   COVID-19 02/02/2021   Diabetes (HCC)    GERD (gastroesophageal reflux disease)    Hypertension    Past Surgical History:  Procedure Laterality Date   eye lesion Left    removed, biopsy  negative   Family History  Problem Relation Age of Onset   Lung cancer Mother        small cell   Heart attack Mother 58   Diabetes Father    Heart attack Father 52   Heart attack Maternal Grandfather 2   Diabetes Sister    Heart disease Sister    Heart attack Paternal Grandmother 18   Asthma Paternal Grandmother    Prostate cancer Paternal Grandfather 16   Hypothyroidism Sister    Social History   Occupational History   Occupation: Retired    Associate Professor: SOCIAL WORKER OF [employer]  Tobacco Use   Smoking status: Former    Types: Cigars   Smokeless tobacco: Never  Vaping Use   Vaping status: Never Used  Substance and Sexual Activity   Alcohol use: Yes    Comment: maybe once a month-beer   Drug use: Never   Sexual activity: Yes    Partners: Female    Birth control/protection: Post-menopausal    Comment: 39 years marriage   Tobacco Counseling Counseling given: Not Answered  SDOH Screenings   Food Insecurity: No Food Insecurity (08/31/2024)  Housing: Low Risk  (08/31/2024)  Transportation Needs: No Transportation Needs (08/31/2024)  Utilities: Not At Risk (08/31/2024)  Alcohol Screen: Low Risk  (08/31/2024)  Depression (PHQ2-9): Low Risk  (08/31/2024)  Financial Resource Strain: Low Risk  (08/31/2024)  Physical Activity: Insufficiently Active (08/31/2024)  Social Connections: Socially Integrated (08/31/2024)  Stress: No Stress Concern Present (08/31/2024)  Tobacco Use: Medium Risk (08/31/2024)  Health Literacy: Adequate Health Literacy (08/31/2024)   See flowsheets for full screening details  Depression Screen PHQ 2 &  9 Depression Scale- Over the past 2 weeks, how often have you been bothered by any of the following problems? Little interest or pleasure in doing things: 0 Feeling down, depressed, or hopeless (PHQ Adolescent also includes...irritable): 0 PHQ-2 Total Score: 0 Trouble falling or staying asleep, or sleeping too much: 0 Feeling tired or having little energy: 0 Poor  appetite or overeating (PHQ Adolescent also includes...weight loss): 0 Feeling bad about yourself - or that you are a failure or have let yourself or your family down: 0 Trouble concentrating on things, such as reading the newspaper or watching television (PHQ Adolescent also includes...like school work): 0 Moving or speaking so slowly that other people could have noticed. Or the opposite - being so fidgety or restless that you have been moving around a lot more than usual: 0 Thoughts that you would be better off dead, or of hurting yourself in some way: 0 PHQ-9 Total Score: 0 If you checked off any problems, how difficult have these problems made it for you to do your work, take care of things at home, or get along with other people?: Not difficult at all  Depression Treatment Depression Interventions/Treatment : EYV7-0 Score <4 Follow-up Not Indicated     Goals Addressed             This Visit's Progress    Patient Stated       To continue to lose weight , and keep taking care of aunt        Visit info / Clinical Intake: Medicare Wellness Visit Type:: Subsequent Annual Wellness Visit Persons participating in visit:: patient Medicare Wellness Visit Mode:: Telephone If telephone:: video declined Because this visit was a virtual/telehealth visit:: pt reported vitals If Telephone or Video please confirm:: I connected with the patient using audio enabled telemedicine application and verified that I am speaking with the correct person using two identifiers; I discussed the limitations of evaluation and management by telemedicine; The patient expressed understanding and agreed to proceed Patient Location:: home Provider Location:: home office Information given by:: patient Interpreter Needed?: No Pre-visit prep was completed: yes AWV questionnaire completed by patient prior to visit?: yes Date:: 08/31/24 Living arrangements:: lives with spouse/significant other Patient's Overall  Health Status Rating: very good Typical amount of pain: none Does pain affect daily life?: no Are you currently prescribed opioids?: no  Dietary Habits and Nutritional Risks How many meals a day?: 3 Eats fruit and vegetables daily?: yes Most meals are obtained by: preparing own meals; eating out In the last 2 weeks, have you had any of the following?: none Diabetic:: no  Functional Status Activities of Daily Living (to include ambulation/medication): (Patient-Rptd) Independent Ambulation: (Patient-Rptd) Independent Medication Administration: Independent Home Management: (Patient-Rptd) Independent Manage your own finances?: yes Primary transportation is: driving Concerns about vision?: no *vision screening is required for WTM* Concerns about hearing?: no  Fall Screening Falls in the past year?: (Patient-Rptd) 0 Number of falls in past year: 0 Was there an injury with Fall?: 0 Fall Risk Category Calculator: 0 Patient Fall Risk Level: Low Fall Risk  Fall Risk Patient at Risk for Falls Due to: No Fall Risks Fall risk Follow up: Falls evaluation completed; Falls prevention discussed  Home and Transportation Safety: All rugs have non-skid backing?: (!) no All stairs or steps have railings?: (!) no Grab bars in the bathtub or shower?: yes Have non-skid surface in bathtub or shower?: yes Good home lighting?: yes Regular seat belt use?: yes Hospital stays in the  last year:: no  Cognitive Assessment Difficulty concentrating, remembering, or making decisions? : no Will 6CIT or Mini Cog be Completed: no 6CIT or Mini Cog Declined: patient declined  Advance Directives (For Healthcare) Does Patient Have a Medical Advance Directive?: Yes Does patient want to make changes to medical advance directive?: No - Patient declined Type of Advance Directive: Healthcare Power of [place]; Out of facility DNR (pink MOST or yellow form); Living will Copy of Healthcare Power of Attorney in  Chart?: No - copy requested Copy of Living Will in Chart?: No - copy requested Out of facility DNR (pink MOST or yellow form) in Chart? (Ambulatory ONLY): No - copy requested  Reviewed/Updated  Reviewed/Updated: Reviewed All (Medical, Surgical, Family, Medications, Allergies, Care Teams, Patient Goals)        Objective:    Today's Vitals   08/31/24 0856  BP: 122/88  Weight: 260 lb (117.9 kg)  Height: 6' 1 (1.854 m)   Body mass index is 34.3 kg/m.  Current Medications (verified) Outpatient Encounter Medications as of 08/31/2024  Medication Sig   atorvastatin  (LIPITOR) 20 MG tablet Take 1 tablet (20 mg total) by mouth every evening. MUST HAVE OV FOR FURTHER REFILLS   blood glucose meter kit and supplies Check sugar once daily. DX E11.9 Contour Next meter and supplies   glimepiride  (AMARYL ) 4 MG tablet TAKE 2 TABLETS BY MOUTH EVERY DAY   glucose blood (CONTOUR NEXT TEST) test strip Check sugar once daily   lisinopril  (ZESTRIL ) 20 MG tablet TAKE 1 TABLET BY MOUTH EVERY DAY   metFORMIN  (GLUCOPHAGE -XR) 500 MG 24 hr tablet TAKE 2 TABLETS BY MOUTH TWICE A DAY WITH MEALS   pantoprazole  (PROTONIX ) 40 MG tablet Take 1 tablet (40 mg total) by mouth daily. MUST HAVE OV FOR FURTHER REFILLS   Semaglutide , 1 MG/DOSE, 4 MG/3ML SOPN Inject 1 mg as directed once a week.   clobetasol  cream (TEMOVATE ) 0.05 % Apply 1 Application topically 2 (two) times daily.   No facility-administered encounter medications on file as of 08/31/2024.   Hearing/Vision screen Hearing Screening - Comments:: Patient has some trouble hearing sometimes . But no hearing aids  Vision Screening - Comments:: Wears readers. He sees Dr, Carolee at [facility] in [place]. Immunizations and Health Maintenance Health Maintenance  Topic Date Due   Zoster Vaccines- Shingrix (1 of 2) Never done   DTaP/Tdap/Td (2 - Td or Tdap) 10/02/2019   Diabetic kidney evaluation - Urine ACR  03/03/2020   FOOT EXAM  10/24/2023    HEMOGLOBIN A1C  01/22/2024   Influenza Vaccine  05/01/2024   COVID-19 Vaccine (5 - 2025-26 season) 06/01/2024   Medicare Annual Wellness (AWV)  07/15/2024   Diabetic kidney evaluation - eGFR measurement  07/23/2024   OPHTHALMOLOGY EXAM  07/28/2024   Colonoscopy  11/26/2026   Pneumococcal Vaccine: 50+ Years  Completed   Hepatitis C Screening  Completed   Meningococcal B Vaccine  Aged Out        Assessment/Plan:  This is a routine wellness examination for [facility].  Patient Care Team: Corwin Antu, FNP as PCP - General (Family Medicine)  I have personally reviewed and noted the following in the patient's chart:   Medical and social history Use of alcohol, tobacco or illicit drugs  Current medications and supplements including opioid prescriptions. Functional ability and status Nutritional status Physical activity Advanced directives List of other physicians Hospitalizations, surgeries, and ER visits in previous 12 months Vitals Screenings to include cognitive, depression, and falls  Referrals and appointments  No orders of the defined types were placed in this encounter.  In addition, I have reviewed and discussed with patient certain preventive protocols, quality metrics, and best practice recommendations. A written personalized care plan for preventive services as well as general preventive health recommendations were provided to patient.   Lyle MARLA Right, [place]   08/31/2024   No follow-ups on file.  After Visit Summary: (MyChart) Due to this being a telephonic visit, the after visit summary with patients personalized plan was offered to patient via MyChart   Nurse Notes: Nothing to report

## 2024-09-07 ENCOUNTER — Ambulatory Visit: Admitting: Family

## 2024-09-07 ENCOUNTER — Encounter: Payer: Self-pay | Admitting: Family

## 2024-09-07 VITALS — BP 128/76 | HR 87 | Temp 98.6°F | Wt 270.0 lb

## 2024-09-07 DIAGNOSIS — E66812 Obesity, class 2: Secondary | ICD-10-CM

## 2024-09-07 DIAGNOSIS — K219 Gastro-esophageal reflux disease without esophagitis: Secondary | ICD-10-CM | POA: Diagnosis not present

## 2024-09-07 DIAGNOSIS — I10 Essential (primary) hypertension: Secondary | ICD-10-CM

## 2024-09-07 DIAGNOSIS — Z125 Encounter for screening for malignant neoplasm of prostate: Secondary | ICD-10-CM

## 2024-09-07 DIAGNOSIS — R339 Retention of urine, unspecified: Secondary | ICD-10-CM

## 2024-09-07 DIAGNOSIS — E1169 Type 2 diabetes mellitus with other specified complication: Secondary | ICD-10-CM

## 2024-09-07 LAB — COMPREHENSIVE METABOLIC PANEL WITH GFR
ALT: 14 U/L (ref 0–53)
AST: 14 U/L (ref 0–37)
Albumin: 4.5 g/dL (ref 3.5–5.2)
Alkaline Phosphatase: 81 U/L (ref 39–117)
BUN: 15 mg/dL (ref 6–23)
CO2: 30 meq/L (ref 19–32)
Calcium: 10 mg/dL (ref 8.4–10.5)
Chloride: 101 meq/L (ref 96–112)
Creatinine, Ser: 0.87 mg/dL (ref 0.40–1.50)
GFR: 87.74 mL/min (ref >60.00)
Glucose, Bld: 94 mg/dL (ref 70–99)
Potassium: 4.9 meq/L (ref 3.5–5.1)
Sodium: 139 meq/L (ref 135–145)
Total Bilirubin: 0.5 mg/dL (ref 0.2–1.2)
Total Protein: 7.2 g/dL (ref 6.0–8.3)

## 2024-09-07 LAB — MICROALBUMIN / CREATININE URINE RATIO
Creatinine,U: 120.9 mg/dL
Microalb Creat Ratio: UNDETERMINED mg/g (ref 0.0–30.0)
Microalb, Ur: <0.7 mg/dL

## 2024-09-07 LAB — LIPID PANEL
Cholesterol: 116 mg/dL (ref 0–200)
HDL: 45.7 mg/dL (ref >39.00)
LDL Cholesterol: 43 mg/dL (ref 0–99)
NonHDL: 70.56
Total CHOL/HDL Ratio: 3
Triglycerides: 140 mg/dL (ref 0.0–149.0)
VLDL: 28 mg/dL (ref 0.0–40.0)

## 2024-09-07 LAB — PSA: PSA: 1.3 ng/mL (ref 0.10–4.00)

## 2024-09-07 LAB — HEMOGLOBIN A1C: Hgb A1c MFr Bld: 6.7 % — ABNORMAL HIGH (ref 4.6–6.5)

## 2024-09-07 MED ORDER — PANTOPRAZOLE SODIUM 20 MG PO TBEC: 20.0000 mg | DELAYED_RELEASE_TABLET | Freq: Every day | ORAL | 2 refills | Status: AC

## 2024-09-07 NOTE — Progress Notes (Unsigned)
 Established Patient Office Visit  Subjective:      CC:  Chief Complaint  Patient presents with   Medical Management of Chronic Issues    DM f/u     HPI:0 James Washington is a 68 y.o. male presenting on 09/07/2024 for Medical Management of Chronic Issues (DM f/u ) .  Discussed the use of AI scribe software for clinical note transcription with the patient, who gave verbal consent to proceed.  History of Present Illness James Washington is a 68 year old male with hypertension, type 2 diabetes, and acid reflux who presents for a routine follow-up visit.  He has not been seen in over a year and is here to manage his chronic conditions including hypertension, type 2 diabetes, and acid reflux. He is currently taking lisinopril  for hypertension, metformin  for diabetes, and pantoprazole  for acid reflux. He has been taking Ozempic  at a dose of 1 mg for diabetes management. He experienced a three-week gap in taking Ozempic  due to a transition in pharmacy services but resumed five weeks ago without any adverse effects.  Regarding his acid reflux, he has been on pantoprazole  for years and has attempted to discontinue it in the past without success due to recurrent heartburn. He describes the sensation as 'weird, almost heart feeling stuff.' He acknowledges a diet that includes spicy and fatty foods, which may exacerbate his symptoms.  He reports decreased sensitivity in his feet over the past six months, noting an incident where he injured his foot without immediate awareness. He has been applying cream to his heels to prevent cracking, which worsens in the winter.  He had an eye exam in late 2024, which was negative for any diabetic retinopathy. He occasionally smokes cigars while playing golf but does not smoke regularly.         Social history:  Relevant past medical, surgical, family and social history reviewed and updated as indicated. Interim medical history since our last  visit reviewed.  Allergies and medications reviewed and updated.  DATA REVIEWED: CHART IN EPIC     ROS: Negative unless specifically indicated above in HPI.    Current Outpatient Medications:    atorvastatin  (LIPITOR) 20 MG tablet, Take 1 tablet (20 mg total) by mouth every evening. MUST HAVE OV FOR FURTHER REFILLS, Disp: 90 tablet, Rfl: 0   blood glucose meter kit and supplies, Check sugar once daily. DX E11.9 Contour Next meter and supplies, Disp: 1 each, Rfl: 12   clobetasol  cream (TEMOVATE ) 0.05 %, Apply 1 Application topically 2 (two) times daily., Disp: 30 g, Rfl: 0   glimepiride  (AMARYL ) 4 MG tablet, TAKE 2 TABLETS BY MOUTH EVERY DAY, Disp: 180 tablet, Rfl: 0   glucose blood (CONTOUR NEXT TEST) test strip, Check sugar once daily, Disp: 100 strip, Rfl: 12   lisinopril  (ZESTRIL ) 20 MG tablet, Take 1 tablet (20 mg total) by mouth daily., Disp: 90 tablet, Rfl: 1   metFORMIN  (GLUCOPHAGE -XR) 500 MG 24 hr tablet, TAKE 2 TABLETS BY MOUTH TWICE A DAY WITH MEALS, Disp: 360 tablet, Rfl: 3   pantoprazole  (PROTONIX ) 20 MG tablet, Take 1 tablet (20 mg total) by mouth daily., Disp: 30 tablet, Rfl: 2   Semaglutide , 1 MG/DOSE, 4 MG/3ML SOPN, Inject 1 mg as directed once a week., Disp: 3 mL, Rfl: 2        Objective:        BP 128/76   Pulse 87   Temp 98.6 F (37 C) (Temporal)   Wt  270 lb (122.5 kg)   SpO2 95%   BMI 35.62 kg/m   Physical Exam EXTREMITIES: Mild swelling in left leg. Pulses palpable. Neuropathy test normal.  Wt Readings from Last 3 Encounters:  09/07/24 270 lb (122.5 kg)  08/31/24 260 lb (117.9 kg)  07/24/23 278 lb 6.4 oz (126.3 kg)    Physical Exam Vitals reviewed.  Constitutional:      General: He is not in acute distress.    Appearance: Normal appearance. He is obese. He is not ill-appearing, toxic-appearing or diaphoretic.  HENT:     Head: Normocephalic.     Right Ear: Tympanic membrane normal.     Left Ear: Tympanic membrane normal.     Nose: Nose  normal.     Mouth/Throat:     Mouth: Mucous membranes are moist.  Eyes:     Pupils: Pupils are equal, round, and reactive to light.  Cardiovascular:     Rate and Rhythm: Normal rate and regular rhythm.     Pulses: Normal pulses.  Pulmonary:     Effort: Pulmonary effort is normal.     Breath sounds: Normal breath sounds.  Abdominal:     General: Abdomen is flat.     Tenderness: There is no abdominal tenderness.  Musculoskeletal:        General: Normal range of motion.     Left lower leg: 1+ Pitting Edema present.  Skin:    General: Skin is warm.  Neurological:     General: No focal deficit present.     Mental Status: He is alert and oriented to person, place, and time. Mental status is at baseline.  Psychiatric:        Mood and Affect: Mood normal.        Behavior: Behavior normal.        Thought Content: Thought content normal.        Judgment: Judgment normal.          Results DIAGNOSTIC Eye examination: Negative for abnormalities (08/2023)  Assessment & Plan:   Assessment and Plan Assessment & Plan Type 2 diabetes mellitus with diabetic neuropathy Type 2 diabetes mellitus with A1c teetering at 7. Diabetic neuropathy with decreased sensation in feet, evidenced by delayed detection of a briar injury. Mild swelling in left leg, likely due to prolonged sitting or standing. Good pulses noted, but purpling suggests possible circulation issues. - Ordered labs to assess current diabetes control - Scheduled follow-up appointment in six months - Advised elevating legs and wearing compression stockings to manage swelling - Instructed to monitor for worsening neuropathy symptoms  Essential hypertension Managed with lisinopril . - Continue lisinopril  for blood pressure management  Obesity, class 2 Obesity, class 2, managed with Ozempic  1 mg. No side effects reported. Recent lapse in medication due to pharmacy issues, but resumed without complications. - Continue Ozempic  1 mg  once weekly  Gastroesophageal reflux disease Managed with pantoprazole  40 mg. Previous attempts to discontinue were unsuccessful due to heartburn. Discussed potential risks of long-term pantoprazole  use, including absorption issues and possible link to Alzheimer's disease. Advised gradual reduction to 20 mg to assess tolerance. Discussed dietary modifications to reduce reflux symptoms, including avoiding spicy, fried, and fatty foods. - Reduce pantoprazole  to 20 mg and monitor for symptoms - Advised dietary modifications to reduce reflux symptoms, including avoiding spicy, fried, and fatty foods  Leg swelling Mild swelling in left leg, likely due to prolonged sitting or standing. Good pulses noted, but purpling suggests possible circulation issues. - Advised  elevating legs and wearing compression stockings to manage swelling -watch salt in diet   General Health Maintenance Prostate level last year was normal. Discussed annual prostate level testing for peace of mind. Recent eye exam in October was negative for diabetic retinopathy. - Offered annual prostate level testing for peace of mind       Return in about 6 months (around 03/08/2025) for f/u CPE.     Ginger Patrick, MSN, APRN, FNP-C [facility]

## 2024-09-08 ENCOUNTER — Ambulatory Visit: Payer: Self-pay | Admitting: Family

## 2024-09-26 ENCOUNTER — Other Ambulatory Visit: Payer: Self-pay | Admitting: Family

## 2024-09-26 DIAGNOSIS — E7849 Other hyperlipidemia: Secondary | ICD-10-CM

## 2024-09-26 DIAGNOSIS — K219 Gastro-esophageal reflux disease without esophagitis: Secondary | ICD-10-CM

## 2024-10-05 DIAGNOSIS — E66812 Obesity, class 2: Secondary | ICD-10-CM

## 2024-10-05 NOTE — Telephone Encounter (Signed)
 Copied from CRM #[id number]. Topic: Clinical - Medication Refill >> Oct 05, 2024  2:05 PM Brittany M wrote: Medication: Semaglutide , 1 MG/DOSE, 4 MG/3ML SOPN  Has the patient contacted their pharmacy? Yes (Agent: If no, request that the patient contact the pharmacy for the refill. If patient does not wish to contact the pharmacy document the reason why and proceed with request.) (Agent: If yes, when and what did the pharmacy advise?)  This is the patient's preferred pharmacy:  [facility] [phone] [address], [place] - [address]  [place] [address] Phone: [phone] Fax: [phone]  Is this the correct pharmacy for this prescription? Yes If no, delete pharmacy and type the correct one.   Has the prescription been filled recently? Yes  Is the patient out of the medication? Yes  Has the patient been seen for an appointment in the last year OR does the patient have an upcoming appointment? Yes  Can we respond through MyChart? Yes  Agent: Please be advised that Rx refills may take up to 3 business days. We ask that you follow-up with your pharmacy.

## 2024-10-06 MED ORDER — SEMAGLUTIDE (1 MG/DOSE) 4 MG/3ML ~~LOC~~ SOPN: 1.0000 mg | PEN_INJECTOR | SUBCUTANEOUS | 5 refills | Status: AC

## 2024-10-07 ENCOUNTER — Other Ambulatory Visit (HOSPITAL_COMMUNITY): Payer: Self-pay

## 2024-10-07 ENCOUNTER — Telehealth: Payer: Self-pay

## 2024-10-07 NOTE — Telephone Encounter (Signed)
 Pharmacy Patient Advocate Encounter   Received notification from [facility] KEY that prior authorization for Ozempic  4 is required/requested.   Insurance verification completed.   The patient is insured through [facility].   Per test claim: PA required; PA submitted to above mentioned insurance via Latent Key/confirmation #/EOC [id number] Status is pending

## 2024-10-07 NOTE — Telephone Encounter (Signed)
 Pharmacy Patient Advocate Encounter  Received notification from [facility] that Prior Authorization for Ozempic  4 has been APPROVED from 10/07/24 to 09/30/98. Unable to obtain price due to refill too soon rejection, last fill date 10/07/24 next available fill date1/27/26   PA #/Case ID/Reference #: # [id number]

## 2024-10-19 ENCOUNTER — Other Ambulatory Visit: Payer: Self-pay | Admitting: Family

## 2024-10-19 DIAGNOSIS — E119 Type 2 diabetes mellitus without complications: Secondary | ICD-10-CM

## 2024-10-19 NOTE — Telephone Encounter (Signed)
 Copied from CRM #[id number]. Topic: Clinical - Medication Question >> Oct 19, 2024  3:43 PM Robinson H wrote: Reason for CRM: Patient calling in to refill his glimepiride  (AMARYL ) 4 MG tablet, advised patient request was received and waiting on provider approval patient states he is out of medication.  Ziare [phone]

## 2024-10-19 NOTE — Telephone Encounter (Signed)
 Refill sent as requested.  James Washington notified of this via telephone.

## 2025-03-08 ENCOUNTER — Encounter: Admitting: Family
